# Patient Record
Sex: Male | Born: 1935 | Race: White | Hispanic: No | Marital: Married | Smoking: Former smoker
Health system: Southern US, Community
[De-identification: ages and names within clinical notes are randomized; demographics above are authoritative.]

## PROBLEM LIST (undated history)

## (undated) DIAGNOSIS — G459 Transient cerebral ischemic attack, unspecified: Secondary | ICD-10-CM

## (undated) DIAGNOSIS — I219 Acute myocardial infarction, unspecified: Secondary | ICD-10-CM

## (undated) DIAGNOSIS — I10 Essential (primary) hypertension: Secondary | ICD-10-CM

## (undated) DIAGNOSIS — K922 Gastrointestinal hemorrhage, unspecified: Secondary | ICD-10-CM

## (undated) DIAGNOSIS — H9192 Unspecified hearing loss, left ear: Secondary | ICD-10-CM

## (undated) DIAGNOSIS — I6529 Occlusion and stenosis of unspecified carotid artery: Secondary | ICD-10-CM

## (undated) DIAGNOSIS — J449 Chronic obstructive pulmonary disease, unspecified: Secondary | ICD-10-CM

## (undated) DIAGNOSIS — E785 Hyperlipidemia, unspecified: Secondary | ICD-10-CM

## (undated) HISTORY — DX: Essential (primary) hypertension: I10

## (undated) HISTORY — DX: Chronic obstructive pulmonary disease, unspecified: J44.9

## (undated) HISTORY — PX: CORONARY ARTERY BYPASS GRAFT: SHX141

## (undated) HISTORY — PX: CHOLECYSTECTOMY: SHX55

## (undated) HISTORY — PX: OTHER SURGICAL HISTORY: SHX169

## (undated) HISTORY — DX: Unspecified hearing loss, left ear: H91.92

## (undated) HISTORY — DX: Gastrointestinal hemorrhage, unspecified: K92.2

## (undated) HISTORY — DX: Occlusion and stenosis of unspecified carotid artery: I65.29

## (undated) HISTORY — DX: Hyperlipidemia, unspecified: E78.5

## (undated) HISTORY — PX: APPENDECTOMY: SHX54

## (undated) HISTORY — DX: Transient cerebral ischemic attack, unspecified: G45.9

## (undated) HISTORY — PX: PROSTATE SURGERY: SHX751

---

## 2003-03-08 ENCOUNTER — Other Ambulatory Visit: Payer: Self-pay

## 2005-07-06 ENCOUNTER — Encounter: Payer: Self-pay | Admitting: Family Medicine

## 2005-07-06 LAB — CONVERTED CEMR LAB: PSA: 0.68 ng/mL

## 2005-10-16 ENCOUNTER — Ambulatory Visit: Payer: Self-pay | Admitting: Unknown Physician Specialty

## 2005-10-23 ENCOUNTER — Emergency Department: Payer: Self-pay | Admitting: Emergency Medicine

## 2005-11-13 ENCOUNTER — Ambulatory Visit: Payer: Self-pay | Admitting: Ophthalmology

## 2005-11-13 ENCOUNTER — Other Ambulatory Visit: Payer: Self-pay

## 2005-12-17 ENCOUNTER — Ambulatory Visit: Payer: Self-pay | Admitting: Family Medicine

## 2006-02-03 ENCOUNTER — Ambulatory Visit: Payer: Self-pay | Admitting: Family Medicine

## 2006-04-12 ENCOUNTER — Emergency Department: Payer: Self-pay | Admitting: Emergency Medicine

## 2006-04-13 ENCOUNTER — Emergency Department: Payer: Self-pay | Admitting: Emergency Medicine

## 2006-04-16 ENCOUNTER — Ambulatory Visit: Payer: Self-pay | Admitting: Specialist

## 2006-04-16 ENCOUNTER — Other Ambulatory Visit: Payer: Self-pay

## 2006-04-23 ENCOUNTER — Ambulatory Visit: Payer: Self-pay | Admitting: Specialist

## 2006-05-04 ENCOUNTER — Ambulatory Visit: Payer: Self-pay | Admitting: Family Medicine

## 2006-05-04 LAB — CONVERTED CEMR LAB
AST: 21 units/L (ref 0–37)
BUN: 20 mg/dL (ref 6–23)
Chloride: 108 meq/L (ref 96–112)
Cholesterol: 191 mg/dL (ref 0–200)
Creatinine, Ser: 1.5 mg/dL (ref 0.4–1.5)
GFR calc Af Amer: 59 mL/min
GFR calc non Af Amer: 49 mL/min
HDL: 41.7 mg/dL (ref >39.0)
Potassium: 4.4 meq/L (ref 3.5–5.1)
Total Bilirubin: 0.6 mg/dL (ref 0.3–1.2)

## 2006-05-21 ENCOUNTER — Ambulatory Visit: Payer: Self-pay | Admitting: Specialist

## 2006-06-22 ENCOUNTER — Ambulatory Visit: Payer: Self-pay | Admitting: Specialist

## 2006-07-24 ENCOUNTER — Ambulatory Visit: Payer: Self-pay | Admitting: Family Medicine

## 2006-08-05 ENCOUNTER — Ambulatory Visit: Payer: Self-pay | Admitting: Family Medicine

## 2006-08-05 DIAGNOSIS — E7800 Pure hypercholesterolemia, unspecified: Secondary | ICD-10-CM

## 2006-08-12 ENCOUNTER — Telehealth: Payer: Self-pay | Admitting: Family Medicine

## 2006-08-12 ENCOUNTER — Encounter: Payer: Self-pay | Admitting: Family Medicine

## 2006-10-13 ENCOUNTER — Telehealth (INDEPENDENT_AMBULATORY_CARE_PROVIDER_SITE_OTHER): Payer: Self-pay | Admitting: *Deleted

## 2006-11-18 ENCOUNTER — Ambulatory Visit: Payer: Self-pay | Admitting: Family Medicine

## 2006-11-18 ENCOUNTER — Encounter (INDEPENDENT_AMBULATORY_CARE_PROVIDER_SITE_OTHER): Payer: Self-pay | Admitting: *Deleted

## 2006-11-18 LAB — CONVERTED CEMR LAB
ALT: 18 units/L (ref 0–40)
Direct LDL: 129.1 mg/dL
HDL: 42.2 mg/dL (ref >39.0)

## 2006-11-19 LAB — CONVERTED CEMR LAB
ALT: 26 units/L (ref 0–53)
AST: 21 units/L (ref 0–37)
Bilirubin, Direct: 0.1 mg/dL (ref 0.0–0.3)
CO2: 29 meq/L (ref 19–32)
Chloride: 106 meq/L (ref 96–112)
Cholesterol: 169 mg/dL (ref 0–200)
Creatinine, Ser: 1.2 mg/dL (ref 0.4–1.5)
GFR calc non Af Amer: 63 mL/min
Glucose, Bld: 95 mg/dL (ref 70–99)
HDL: 43.9 mg/dL (ref >39.0)
LDL Cholesterol: 104 mg/dL — ABNORMAL HIGH (ref 0–99)
Sodium: 138 meq/L (ref 135–145)
Total Bilirubin: 0.9 mg/dL (ref 0.3–1.2)
Total Protein: 6.5 g/dL (ref 6.0–8.3)

## 2007-02-02 ENCOUNTER — Encounter: Payer: Self-pay | Admitting: Family Medicine

## 2007-02-02 DIAGNOSIS — I10 Essential (primary) hypertension: Secondary | ICD-10-CM

## 2007-02-03 ENCOUNTER — Ambulatory Visit: Payer: Self-pay | Admitting: Family Medicine

## 2007-02-10 ENCOUNTER — Ambulatory Visit: Payer: Self-pay | Admitting: Cardiology

## 2007-02-19 ENCOUNTER — Encounter (INDEPENDENT_AMBULATORY_CARE_PROVIDER_SITE_OTHER): Payer: Self-pay | Admitting: *Deleted

## 2007-02-23 ENCOUNTER — Ambulatory Visit: Payer: Self-pay | Admitting: Family Medicine

## 2007-02-23 LAB — CONVERTED CEMR LAB
ALT: 19 units/L (ref 0–53)
AST: 19 units/L (ref 0–37)
Direct LDL: 141.4 mg/dL

## 2007-08-16 ENCOUNTER — Ambulatory Visit: Payer: Self-pay | Admitting: Family Medicine

## 2007-08-16 DIAGNOSIS — I25810 Atherosclerosis of coronary artery bypass graft(s) without angina pectoris: Secondary | ICD-10-CM

## 2007-08-17 ENCOUNTER — Ambulatory Visit: Payer: Self-pay | Admitting: Cardiology

## 2007-08-18 ENCOUNTER — Encounter: Payer: Self-pay | Admitting: Cardiology

## 2007-08-18 LAB — CONVERTED CEMR LAB
BUN: 23 mg/dL (ref 6–23)
Basophils Relative: 1 % (ref 0–1)
Chloride: 109 meq/L (ref 96–112)
Glucose, Bld: 99 mg/dL (ref 70–99)
Hemoglobin: 12.7 g/dL — ABNORMAL LOW (ref 13.0–17.0)
INR: 0.9 (ref 0.0–1.5)
Lymphocytes Relative: 21 % (ref 12–46)
Monocytes Absolute: 0.8 10*3/uL (ref 0.1–1.0)
Monocytes Relative: 12 % (ref 3–12)
Neutro Abs: 4 10*3/uL (ref 1.7–7.7)
Neutrophils Relative %: 61 % (ref 43–77)
RBC: 3.96 M/uL — ABNORMAL LOW (ref 4.22–5.81)
Sodium: 140 meq/L (ref 135–145)
WBC: 6.5 10*3/uL (ref 4.0–10.5)
aPTT: 34 s (ref 24–37)

## 2007-08-20 ENCOUNTER — Ambulatory Visit: Payer: Self-pay | Admitting: Family Medicine

## 2007-08-24 ENCOUNTER — Inpatient Hospital Stay (HOSPITAL_COMMUNITY): Admission: AD | Admit: 2007-08-24 | Discharge: 2007-08-29 | Payer: Self-pay | Admitting: Cardiology

## 2007-08-24 ENCOUNTER — Ambulatory Visit: Payer: Self-pay | Admitting: Cardiology

## 2007-08-24 ENCOUNTER — Encounter: Payer: Self-pay | Admitting: Surgery

## 2007-08-24 ENCOUNTER — Inpatient Hospital Stay (HOSPITAL_BASED_OUTPATIENT_CLINIC_OR_DEPARTMENT_OTHER): Admission: RE | Admit: 2007-08-24 | Discharge: 2007-08-24 | Payer: Self-pay | Admitting: Cardiology

## 2007-08-24 ENCOUNTER — Ambulatory Visit: Payer: Self-pay | Admitting: Surgery

## 2007-08-25 LAB — CONVERTED CEMR LAB
ALT: 14 units/L (ref 0–53)
Cholesterol: 177 mg/dL (ref 0–200)
LDL Cholesterol: 115 mg/dL — ABNORMAL HIGH (ref 0–99)

## 2007-09-10 ENCOUNTER — Ambulatory Visit: Payer: Self-pay | Admitting: Cardiology

## 2007-09-14 ENCOUNTER — Encounter: Admission: RE | Admit: 2007-09-14 | Discharge: 2007-09-14 | Payer: Self-pay | Admitting: Surgery

## 2007-09-14 ENCOUNTER — Ambulatory Visit: Payer: Self-pay | Admitting: Surgery

## 2007-09-27 ENCOUNTER — Ambulatory Visit: Payer: Self-pay | Admitting: Thoracic Surgery (Cardiothoracic Vascular Surgery)

## 2007-10-05 ENCOUNTER — Ambulatory Visit: Payer: Self-pay | Admitting: Surgery

## 2007-11-19 ENCOUNTER — Ambulatory Visit: Payer: Self-pay | Admitting: Internal Medicine

## 2008-04-10 ENCOUNTER — Ambulatory Visit: Payer: Self-pay | Admitting: Family Medicine

## 2008-04-10 DIAGNOSIS — L219 Seborrheic dermatitis, unspecified: Secondary | ICD-10-CM

## 2008-04-25 ENCOUNTER — Ambulatory Visit: Payer: Self-pay | Admitting: Family Medicine

## 2008-04-25 LAB — CONVERTED CEMR LAB: Cholesterol, target level: 200 mg/dL

## 2008-04-26 ENCOUNTER — Encounter: Payer: Self-pay | Admitting: Family Medicine

## 2008-05-29 ENCOUNTER — Ambulatory Visit: Payer: Self-pay | Admitting: Family Medicine

## 2008-05-29 LAB — CONVERTED CEMR LAB: OCCULT 1: NEGATIVE

## 2008-05-30 ENCOUNTER — Encounter (INDEPENDENT_AMBULATORY_CARE_PROVIDER_SITE_OTHER): Payer: Self-pay | Admitting: *Deleted

## 2008-09-08 ENCOUNTER — Ambulatory Visit: Payer: Self-pay | Admitting: Family Medicine

## 2008-10-27 ENCOUNTER — Ambulatory Visit: Payer: Self-pay | Admitting: Internal Medicine

## 2008-10-27 ENCOUNTER — Ambulatory Visit: Payer: Self-pay | Admitting: Family Medicine

## 2008-10-30 ENCOUNTER — Ambulatory Visit: Payer: Self-pay | Admitting: Cardiovascular Disease

## 2008-10-30 ENCOUNTER — Encounter: Payer: Self-pay | Admitting: Internal Medicine

## 2008-10-30 LAB — CONVERTED CEMR LAB
Alkaline Phosphatase: 44 units/L (ref 39–117)
Bilirubin, Direct: 0.1 mg/dL (ref 0.0–0.3)
Calcium: 9.2 mg/dL (ref 8.4–10.5)
GFR calc non Af Amer: 48.45 mL/min (ref >60)
HDL: 42.3 mg/dL (ref >39.00)
LDL Cholesterol: 110 mg/dL — ABNORMAL HIGH (ref 0–99)
Sodium: 139 meq/L (ref 135–145)
Total Bilirubin: 0.6 mg/dL (ref 0.3–1.2)
Total CHOL/HDL Ratio: 4
VLDL: 12.2 mg/dL (ref 0.0–40.0)

## 2009-01-31 ENCOUNTER — Ambulatory Visit: Payer: Self-pay | Admitting: Family Medicine

## 2009-01-31 LAB — CONVERTED CEMR LAB
Albumin: 3.8 g/dL (ref 3.5–5.2)
Alkaline Phosphatase: 38 units/L — ABNORMAL LOW (ref 39–117)
Bilirubin, Direct: 0 mg/dL (ref 0.0–0.3)
Calcium: 9.2 mg/dL (ref 8.4–10.5)
Creatinine, Ser: 1.4 mg/dL (ref 0.4–1.5)
GFR calc non Af Amer: 52.43 mL/min (ref >60)
HDL: 50 mg/dL (ref >39.00)
Sodium: 141 meq/L (ref 135–145)
Total CHOL/HDL Ratio: 3
VLDL: 15.6 mg/dL (ref 0.0–40.0)

## 2009-02-02 ENCOUNTER — Ambulatory Visit: Payer: Self-pay | Admitting: Family Medicine

## 2009-02-02 DIAGNOSIS — K5900 Constipation, unspecified: Secondary | ICD-10-CM | POA: Insufficient documentation

## 2009-02-02 LAB — CONVERTED CEMR LAB
Bilirubin Urine: NEGATIVE
Ketones, urine, test strip: NEGATIVE
Nitrite: NEGATIVE
Urobilinogen, UA: 0.2
pH: 5

## 2009-02-03 ENCOUNTER — Encounter: Payer: Self-pay | Admitting: Family Medicine

## 2009-02-16 ENCOUNTER — Ambulatory Visit: Payer: Self-pay | Admitting: Family Medicine

## 2009-02-19 ENCOUNTER — Encounter (INDEPENDENT_AMBULATORY_CARE_PROVIDER_SITE_OTHER): Payer: Self-pay | Admitting: *Deleted

## 2009-03-15 ENCOUNTER — Encounter: Payer: Self-pay | Admitting: Family Medicine

## 2009-03-16 ENCOUNTER — Ambulatory Visit: Payer: Self-pay | Admitting: Family Medicine

## 2009-03-16 DIAGNOSIS — R413 Other amnesia: Secondary | ICD-10-CM

## 2009-03-23 ENCOUNTER — Ambulatory Visit: Payer: Self-pay | Admitting: Internal Medicine

## 2009-03-27 ENCOUNTER — Inpatient Hospital Stay (HOSPITAL_BASED_OUTPATIENT_CLINIC_OR_DEPARTMENT_OTHER): Admission: RE | Admit: 2009-03-27 | Discharge: 2009-03-27 | Payer: Self-pay | Admitting: Internal Medicine

## 2009-03-27 ENCOUNTER — Ambulatory Visit: Payer: Self-pay | Admitting: Internal Medicine

## 2009-03-27 LAB — CONVERTED CEMR LAB
BUN: 27 mg/dL — ABNORMAL HIGH (ref 6–23)
Calcium: 10.1 mg/dL (ref 8.4–10.5)
Chloride: 104 meq/L (ref 96–112)
Creatinine, Ser: 1.41 mg/dL (ref 0.40–1.50)
INR: 1.05 (ref <1.50)
MCHC: 33.1 g/dL (ref 30.0–36.0)
Platelets: 239 10*3/uL (ref 150–400)
Prothrombin Time: 13.6 s (ref 11.6–15.2)
RBC: 4.08 M/uL — ABNORMAL LOW (ref 4.22–5.81)

## 2009-04-03 ENCOUNTER — Ambulatory Visit: Payer: Self-pay

## 2009-04-03 ENCOUNTER — Encounter: Payer: Self-pay | Admitting: Internal Medicine

## 2009-04-04 ENCOUNTER — Telehealth (INDEPENDENT_AMBULATORY_CARE_PROVIDER_SITE_OTHER): Payer: Self-pay | Admitting: *Deleted

## 2009-04-10 ENCOUNTER — Encounter: Payer: Self-pay | Admitting: Internal Medicine

## 2009-04-10 ENCOUNTER — Ambulatory Visit: Payer: Self-pay | Admitting: Internal Medicine

## 2009-04-30 ENCOUNTER — Encounter: Payer: Self-pay | Admitting: Internal Medicine

## 2009-05-01 ENCOUNTER — Encounter: Payer: Self-pay | Admitting: Internal Medicine

## 2009-05-01 ENCOUNTER — Ambulatory Visit: Payer: Self-pay

## 2009-05-03 ENCOUNTER — Ambulatory Visit: Payer: Self-pay | Admitting: Internal Medicine

## 2009-05-03 ENCOUNTER — Encounter: Payer: Self-pay | Admitting: Internal Medicine

## 2009-05-03 DIAGNOSIS — I6529 Occlusion and stenosis of unspecified carotid artery: Secondary | ICD-10-CM | POA: Insufficient documentation

## 2009-05-22 ENCOUNTER — Ambulatory Visit: Payer: Self-pay | Admitting: Family Medicine

## 2009-05-22 ENCOUNTER — Encounter (INDEPENDENT_AMBULATORY_CARE_PROVIDER_SITE_OTHER): Payer: Self-pay | Admitting: *Deleted

## 2009-05-22 DIAGNOSIS — J449 Chronic obstructive pulmonary disease, unspecified: Secondary | ICD-10-CM

## 2009-05-22 DIAGNOSIS — J4489 Other specified chronic obstructive pulmonary disease: Secondary | ICD-10-CM | POA: Insufficient documentation

## 2009-05-25 LAB — CONVERTED CEMR LAB
ALT: 15 units/L (ref 0–53)
AST: 19 units/L (ref 0–37)
BUN: 20 mg/dL (ref 6–23)
Basophils Absolute: 0.1 10*3/uL (ref 0.0–0.1)
Bilirubin, Direct: 0.1 mg/dL (ref 0.0–0.3)
Chloride: 107 meq/L (ref 96–112)
Eosinophils Absolute: 0.3 10*3/uL (ref 0.0–0.7)
GFR calc non Af Amer: 52.39 mL/min (ref >60)
Lymphocytes Relative: 23.7 % (ref 12.0–46.0)
MCHC: 34.6 g/dL (ref 30.0–36.0)
MCV: 95.5 fL (ref 78.0–100.0)
Monocytes Absolute: 0.6 10*3/uL (ref 0.1–1.0)
Neutrophils Relative %: 56.7 % (ref 43.0–77.0)
Platelets: 211 10*3/uL (ref 150.0–400.0)
Potassium: 4.3 meq/L (ref 3.5–5.1)
RBC: 3.75 M/uL — ABNORMAL LOW (ref 4.22–5.81)
RDW: 12.1 % (ref 11.5–14.6)
TSH: 2.21 microintl units/mL (ref 0.35–5.50)
Total Bilirubin: 0.4 mg/dL (ref 0.3–1.2)
Vit D, 25-Hydroxy: 49 ng/mL (ref 30–89)
Vitamin B-12: 241 pg/mL (ref 211–911)

## 2009-06-28 ENCOUNTER — Telehealth: Payer: Self-pay | Admitting: Family Medicine

## 2009-07-18 ENCOUNTER — Ambulatory Visit: Payer: Self-pay | Admitting: Family Medicine

## 2009-07-18 LAB — CONVERTED CEMR LAB
ALT: 14 units/L (ref 0–53)
Albumin: 3.9 g/dL (ref 3.5–5.2)
Alkaline Phosphatase: 42 units/L (ref 39–117)
Bilirubin, Direct: 0.1 mg/dL (ref 0.0–0.3)
CO2: 31 meq/L (ref 19–32)
Calcium: 9.4 mg/dL (ref 8.4–10.5)
Chloride: 107 meq/L (ref 96–112)
Creatinine, Ser: 1.5 mg/dL (ref 0.4–1.5)
Glucose, Bld: 88 mg/dL (ref 70–99)
Sodium: 143 meq/L (ref 135–145)
Total CHOL/HDL Ratio: 3
Total Protein: 7 g/dL (ref 6.0–8.3)

## 2009-10-29 ENCOUNTER — Ambulatory Visit: Payer: Self-pay | Admitting: Family Medicine

## 2009-11-02 LAB — CONVERTED CEMR LAB
Cholesterol: 186 mg/dL (ref 0–200)
HDL: 50.1 mg/dL (ref >39.00)
LDL Cholesterol: 123 mg/dL — ABNORMAL HIGH (ref 0–99)
VLDL: 12.8 mg/dL (ref 0.0–40.0)

## 2009-11-05 ENCOUNTER — Encounter: Payer: Self-pay | Admitting: Internal Medicine

## 2009-11-06 ENCOUNTER — Ambulatory Visit: Payer: Self-pay

## 2009-11-06 ENCOUNTER — Encounter: Payer: Self-pay | Admitting: Internal Medicine

## 2009-11-07 ENCOUNTER — Encounter (INDEPENDENT_AMBULATORY_CARE_PROVIDER_SITE_OTHER): Payer: Self-pay | Admitting: *Deleted

## 2010-01-04 ENCOUNTER — Encounter (INDEPENDENT_AMBULATORY_CARE_PROVIDER_SITE_OTHER): Payer: Self-pay | Admitting: *Deleted

## 2010-01-22 ENCOUNTER — Ambulatory Visit: Payer: Self-pay | Admitting: Family Medicine

## 2010-01-22 LAB — CONVERTED CEMR LAB
Albumin: 3.5 g/dL (ref 3.5–5.2)
Alkaline Phosphatase: 44 units/L (ref 39–117)
Bilirubin, Direct: 0.1 mg/dL (ref 0.0–0.3)
Calcium: 9.3 mg/dL (ref 8.4–10.5)
GFR calc non Af Amer: 45.82 mL/min (ref >60)
HDL: 44.4 mg/dL (ref >39.00)
LDL Cholesterol: 90 mg/dL (ref 0–99)
Potassium: 4.6 meq/L (ref 3.5–5.1)
Sodium: 143 meq/L (ref 135–145)
Total Bilirubin: 0.6 mg/dL (ref 0.3–1.2)
Total CHOL/HDL Ratio: 3
VLDL: 17.8 mg/dL (ref 0.0–40.0)

## 2010-01-25 ENCOUNTER — Ambulatory Visit: Payer: Self-pay | Admitting: Family Medicine

## 2010-01-25 ENCOUNTER — Ambulatory Visit: Payer: Self-pay | Admitting: Internal Medicine

## 2010-01-25 LAB — HM COLONOSCOPY

## 2010-01-29 ENCOUNTER — Telehealth: Payer: Self-pay | Admitting: Internal Medicine

## 2010-02-04 ENCOUNTER — Telehealth: Payer: Self-pay | Admitting: Internal Medicine

## 2010-02-12 ENCOUNTER — Ambulatory Visit: Payer: Self-pay | Admitting: Family Medicine

## 2010-02-12 LAB — FECAL OCCULT BLOOD, GUAIAC: Fecal Occult Blood: NEGATIVE

## 2010-02-13 ENCOUNTER — Ambulatory Visit: Payer: Self-pay | Admitting: Family Medicine

## 2010-02-13 ENCOUNTER — Encounter (INDEPENDENT_AMBULATORY_CARE_PROVIDER_SITE_OTHER): Payer: Self-pay | Admitting: *Deleted

## 2010-02-21 ENCOUNTER — Ambulatory Visit: Payer: Self-pay | Admitting: Internal Medicine

## 2010-02-21 ENCOUNTER — Ambulatory Visit (HOSPITAL_COMMUNITY): Admission: RE | Admit: 2010-02-21 | Discharge: 2010-02-21 | Payer: Self-pay | Admitting: Internal Medicine

## 2010-02-26 ENCOUNTER — Encounter: Payer: Self-pay | Admitting: Family Medicine

## 2010-04-12 ENCOUNTER — Encounter: Payer: Self-pay | Admitting: Family Medicine

## 2010-04-12 ENCOUNTER — Ambulatory Visit
Admission: RE | Admit: 2010-04-12 | Discharge: 2010-04-12 | Payer: Self-pay | Source: Home / Self Care | Attending: Family Medicine | Admitting: Family Medicine

## 2010-05-07 NOTE — Assessment & Plan Note (Signed)
Summary: HEAD AND NECK PAIN/DLO   Vital Signs:  Patient profile:   77 year old male Height:      64 inches Weight:      142.0 pounds BMI:     24.46 Temp:     98.8 degrees F oral Pulse rate:   62 / minute Pulse rhythm:   regular BP sitting:   130 / 62  (left arm) Cuff size:   regular  Vitals Entered By: Benny Lennert CMA Duncan Dull) (February 13, 2010 10:52 AM)  History of Present Illness: Chief complaint head and neck pain  PAin sharp in left posteriro occiput.Marland Kitchenlasted 3 days. started 1 week ago. HAD noted pain at last OV 4 months ago. Feels like neck tight when flexing pain back. TTp over left occiput. Cannot move head side to side with out pain...most painful turing face to left. No fall or injury in neck recently.  No pain radiating down arm... some pain in left upper back/shoulder. No numbness or weakness in arms.  Hears crackling sound when moving neck occ.   Plain films showed severe disc degeneration and arythritis in neck. Initial improvement with diclofenac..none now. Cyclobenzaprin helped first time he too it but not much.        Problems Prior to Update: 1)  Dyspnea  (ICD-786.05) 2)  Neck Pain, Acute  (ICD-723.1) 3)  Wound, Hand  (ICD-882.0) 4)  Copd, Mild  (ICD-496) 5)  Carotid Artery Stenosis  (ICD-433.10) 6)  Memory Loss  (ICD-780.93) 7)  Constipation  (ICD-564.00) 8)  Cad  (ICD-414.00) 9)  Hypertension  (ICD-401.9) 10)  Hypercholesterolemia  (ICD-272.0) 11)  Dermatitis, Seborrheic  (ICD-690.10) 12)  Special Screening Malig Neoplasms Other Sites  (ICD-V76.49) 13)  Need Prophylactic Vaccination&inoculation Flu  (ICD-V04.81)  Current Medications (verified): 1)  Lisinopril 40 Mg Tabs (Lisinopril) .... Take 1 Tablet By Mouth Once A Day 2)  Fenofibrate 160 Mg  Tabs (Fenofibrate) .... Take 1 Tablet By Mouth Once A Day 3)  Adult Aspirin Ec Low Strength 81 Mg  Tbec (Aspirin) .... Take 1 Tablet By Mouth Once A Day 4)  Carvedilol 6.25 Mg Tabs (Carvedilol)  .... Take 1 Tablet By Mouth Two Times A Day 5)  Pravastatin Sodium 40 Mg Tabs (Pravastatin Sodium) .... Take One By Mouth Daily 6)  Ventolin Hfa 108 (90 Base) Mcg/act Aers (Albuterol Sulfate) .... 2 Puffs Q 4 Hours As Needed Shortness of Breath 7)  Diclofenac Sodium 75 Mg Tbec (Diclofenac Sodium) .Marland Kitchen.. 1 Tab By Mouth Two Times A Day  Allergies (verified): No Known Drug Allergies  Past History:  Past medical, surgical, family and social histories (including risk factors) reviewed, and no changes noted (except as noted below).  Past Medical History: Reviewed history from 03/23/2009 and no changes required. 1. COPD 2. Hyperlipidemia 3. Hypertension 4. CAD, s/p CABG x 4 5. Carotid stenosis    a. u/s 7/10  R: 60-79% L 40-59%  Past Surgical History: Reviewed history from 04/25/2008 and no changes required.               PFT's 2003      Thumb surgery - [facility]               Appendectomy                Prostate Surgery - for nodules,  benign 2005       Treadmill Stress Test - EF 49% (-) 2004       Hosp. - weakness, dizzy (-)  workup 2007       Carotid doppler (-) 2007       Kidney stone, lithotripsy 2009 CABG: Dr Adriana Reams  Family History: Reviewed history from 02/02/2007 and no changes required. Father: Died 80, CAD, emphysema Mother: Died 45, CAD Siblings: 12 siblings, sister with emphysema (+) MI < 57, age 77 CV:  (+) CAD  Social History: Reviewed history from 02/02/2007 and no changes required. 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 CV:  Denies chest pain or discomfort. Resp:  stable SOB.Marland Kitchen GI:  Denies abdominal pain. Neuro:  Denies falling  down, inability to speak, memory loss, numbness, poor balance, seizures, tingling, and weakness.  Physical Exam  General:  elderly appearing male in NAD Mouth:  Oral mucosa and oropharynx without lesions or exudates.  Teeth in good repair. Lungs:  Normal respiratory effort, chest expands symmetrically. Lungs are clear to auscultation, no crackles or wheezes. Heart:  Normal rate and regular rhythm. S1 and S2 normal without gallop, murmur, click, rub or other extra sounds. Msk:  ttp over insertion of trapezius, mild central vertebral ttp, trigger point in left upper shoulder decrease ROM of neck, crepitus heard with ROM  Pulses:  R and L posterior tibial pulses are full and equal bilaterally  Neurologic:  No cranial nerve deficits noted. Station and gait are normal. Plantar reflexes are down-going bilaterally. DTRs are symmetrical throughout. Sensory, motor and coordinative functions appear intact.   Impression & Recommendations:  Problem # 1:  NECK PAIN, ACUTE (ICD-723.1) Severe dengerative changes in neck on plain film. Minimal response to diclofenac..will try meloxicam, contnue heat and home PT. Refer to PM and R for consideration of further treatment and possibly cervical spine steroid injection. No suggestion of radiculopaty at this point.  The following medications were removed from the medication list:    Diclofenac Sodium 75 Mg Tbec (Diclofenac sodium) .Marland Kitchen... 1 tab by mouth two times a day    Cyclobenzaprine Hcl 10 Mg Tabs (Cyclobenzaprine hcl) .Marland Kitchen... 1 tab by mouth at bedtime as needed muscle spasm His updated medication list for this problem includes:    Adult Aspirin Ec Low Strength 81 Mg Tbec (Aspirin) .Marland Kitchen... Take 1 tablet by mouth once a day    Meloxicam 15 Mg Tabs (Meloxicam) .Marland Kitchen... 1 tab by mouth daily  Orders: Misc. Referral (Misc. Ref)  Complete Medication List: 1)  Lisinopril 40 Mg Tabs (Lisinopril) .... Take 1 tablet by mouth once a day 2)  Fenofibrate 160 Mg Tabs  (Fenofibrate) .... Take 1 tablet by mouth once a day 3)  Adult Aspirin Ec Low Strength 81 Mg Tbec (Aspirin) .... Take 1 tablet by mouth once a day 4)  Carvedilol 6.25 Mg Tabs (Carvedilol) .... Take 1 tablet by mouth two times a day 5)  Pravastatin Sodium 40 Mg Tabs (Pravastatin sodium) .... Take one by mouth daily 6)  Ventolin Hfa 108 (90 Base) Mcg/act Aers (Albuterol sulfate) .... 2 puffs q 4 hours as needed shortness of breath 7)  Meloxicam 15 Mg Tabs (Meloxicam) .Marland Kitchen.. 1 tab by mouth daily  Patient Instructions: 1)  Stop diclofenac. 2)  Use meloxicam for pain. 3)  May use muscle relaxant at night if needed. 4)  Heat on neck with heating pad. 5)  Gentle stretching exercises.  6)  Referral Appointment Information 7)  Day/Date: 8)  Time: 9)  Place/MD: 10)  Address: 11)  Phone/Fax: 12)  Patient given appointment information. Information/Orders faxed/mailed.  Prescriptions: MELOXICAM 15 MG TABS (MELOXICAM) 1 tab by mouth daily  #30 x 0   Entered and Authorized by:   Kerby Nora MD   Signed by:   Kerby Nora MD on 02/13/2010   Method used:   [facility] to        [facility] [phone]* (retail)       [address] [place], [place]  [address]       Ph: [phone] or [phone]       Fax: [phone]   RxID:   [id number]    Orders Added: 1)  Misc. Referral [Misc. Ref] 2)  Est. Patient Level III [25956]      Current Allergies (reviewed today): No known allergies

## 2010-05-07 NOTE — Progress Notes (Signed)
Summary: GI referral cancelled.  Phone Note Outgoing Call   Summary of Call: GI Referral: Pt cancelled referral to GI physician Dr. Arlyce Dice for Constipation- Says he is much better, did not need the referral. Pt cancelled the appt. Order completed.Daine Gip  June 28, 2009 1:51 PM  Initial call taken by: Daine Gip,  June 28, 2009 1:51 PM

## 2010-05-07 NOTE — Miscellaneous (Signed)
Summary: Orders Update  Clinical Lists Changes  Orders: Added new Test order of Carotid Duplex (Carotid Duplex) - Signed 

## 2010-05-07 NOTE — Consult Note (Signed)
Summary: [facility]  [facility]   Imported By: Lanelle Bal 03/07/2010 12:45:45  _____________________________________________________________________  External Attachment:    Type:   Image     Comment:   External Document

## 2010-05-07 NOTE — Progress Notes (Signed)
Summary: Lance Bosch referral   Phone Note Outgoing Call   Call placed by: Benedict Needy, RN,  January 29, 2010 4:00 PM Call placed to: Patient Summary of Call: Attempted to call pt to make him aware of appts CPX at [facility] 11/17 11:30 Arrive at 11 am Dr. Elly Modena 11/21 at 2:10 arrive at 1:45 [facility]  Initial call taken by: Benedict Needy, RN,  January 29, 2010 4:02 PM  Follow-up for Phone Call        [facility] Benedict Needy, RN  January 30, 2010 4:47 PM   Pt called back.  Gave him lab result, and appt location, dates and times for CPX, and pulmonary referral.  Follow-up by: Cloyde Reams RN,  February 01, 2010 10:23 AM

## 2010-05-07 NOTE — Letter (Signed)
Summary: Appointment - Missed  [facility], Main Office  [address]   [place], [place] [address]   Phone: [phone]  Fax: [phone]     November 07, 2009 MRN: [id number]   Christian [name] [address] [place], [place]  [address]   Dear [name],  Our records indicate you missed your appointment on 11-06-2009   with  Dr. Gala Romney It is very important that we reach you to reschedule this appointment. We look forward to participating in your health care needs. Please contact us at the number listed above at your earliest convenience to reschedule this appointment.     Sincerely,   Lorne Skeens  [facility] Scheduling Team

## 2010-05-07 NOTE — Assessment & Plan Note (Signed)
Summary: ROV/AMD      Allergies Added: NKDA  Visit Type:  Follow-up Primary Provider:  Kerby Nora MD  CC:  c/o shortness of breath..  History of Present Illness: [name] is a very pleasant 77 year old male with history of coronary artery disease, status post bypass surgery in 2009.  He has a normal ejection fraction.  PMH also notable for HTN, HL, carotid stenosis and COPD. Returns for routine f/u.  At last visit was having some CP when carrying wood up steps. Had cath 12/10   1. Three-vessel coronary artery disease.   2. Saphenous vein graft to the posterior descending artery is patent.   3. Left internal mammary artery to left anterior descending is patent.   4. Saphenous vein graft to the obtuse marginal system is totally       occluded ostially, but  there is only moderate coronary artery       disease in the native left circumflex.   5. Normal left ventricular function.   Conclusion: His revascularization is stable despite the loss of a  vein graft.  I am not sure what is causing his exertional symptoms.  We  will check an echocardiogram and PFTs. Proceed with medical therapy  PFTs showed small airway disease. Saw Dr. Ermalene Searing who started him on albuterol inhaler which has helped markedly.  Also had echo EF 55-65%.   Cartoid u/s 1/11 R 60-79% L 40-59% stable  Returns for f/u. Still having problems with exertional dyspnea. Limiting what he does. That said, can walk at normal pace for 1/2 mile or more. Denies CP or HF. Also having problems with severe neck pain over back of neck which limit his head turning.    Current Medications (verified): 1)  Lisinopril 40 Mg Tabs (Lisinopril) .... Take 1 Tablet By Mouth Once A Day 2)  Fenofibrate 160 Mg  Tabs (Fenofibrate) .... Take 1 Tablet By Mouth Once A Day 3)  Adult Aspirin Ec Low Strength 81 Mg  Tbec (Aspirin) .... Take 1 Tablet By Mouth Once A Day 4)  Carvedilol 6.25 Mg Tabs (Carvedilol) .... Take 1 Tablet By Mouth Two Times A  Day 5)  Pravastatin Sodium 40 Mg Tabs (Pravastatin Sodium) .... Take One By Mouth Daily 6)  Ventolin Hfa 108 (90 Base) Mcg/act Aers (Albuterol Sulfate) .... 2 Puffs Q 4 Hours As Needed Shortness of Breath 7)  Diclofenac Sodium 75 Mg Tbec (Diclofenac Sodium) .Marland Kitchen.. 1 Tab By Mouth Two Times A Day 8)  Cyclobenzaprine Hcl 10 Mg Tabs (Cyclobenzaprine Hcl) .Marland Kitchen.. 1 Tab By Mouth At Bedtime As Needed Muscle Spasm  Allergies (verified): No Known Drug Allergies  Past History:  Past Medical History: Last updated: 03/23/2009 1. COPD 2. Hyperlipidemia 3. Hypertension 4. CAD, s/p CABG x 4 5. Carotid stenosis    a. u/s 7/10  R: 60-79% L 40-59%  Past Surgical History: Last updated: 04/25/2008               PFT's 2003      Thumb surgery - [facility]               Appendectomy                Prostate Surgery - for nodules,  benign 2005       Treadmill Stress Test - EF 49% (-) 2004       Hosp. 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 no resp difficulty sats 98% at rest. down to 96% with exertion HEENT: normal Neck: .sore of trapezius muscles.no JVD. Carotids 2+ bilat; R side bruit. No lymphadenopathy or thryomegaly appreciatted. Cor: PMI nondisplaced. Regular rate & rhythm. No rubs, murmur. +s4 Lungs: clear with decreased air movement Abdomen: soft, nontender, nondistended. Good bowel sounds. Extremities: no cyanosis, clubbing, rash, edema. gorin site no bruit or hematoma Neuro: alert & orientedx3, cranial nerves grossly intact. moves all 4 extremities w/o difficulty. affect pleasant    Impression & Recommendations:  Problem # 1:  CAD (ICD-414.00) Stable. No evidence of ischemia. Recent cath reassuring.  Continue current regimen.  Problem # 2:  DYSPNEA (ICD-786.05) Still struggling with fairly severe dyspnea. Will get CPX and refer to pulmonary for evaluation.   Problem # 3:  Shoulder pain Check ESR to exclude polymalgia rheumatica.   Problem # 4:  CAROTID ARTERY STENOSIS (ICD-433.10) Stable. Asymptomatic. F/u carortid u/s in 1 year.   Other Orders: EKG w/ Interpretation (93000) T-Sed Rate (Automated) ([address]) CPX Test at [facility] (CPX Test)

## 2010-05-07 NOTE — Assessment & Plan Note (Signed)
Summary: remove stitches/alc   Vital Signs:  Patient profile:   77 year old male Weight:      145.13 pounds Temp:     98.6 degrees F oral Pulse rate:   60 / minute Pulse rhythm:   regular BP sitting:   140 / 60  (left arm) Cuff size:   regular  Vitals Entered By: Janee Morn CMA (October 29, 2009 12:26 PM) CC: Remove stitches/SOB with exhertion x3 weeks   History of Present Illness: patient is a 77 year old male who presented for suture removal followup, on a procedure and trauma that  was not seen thing care of our office, and was seen and evaluated by the emergency room. Loose clean dry and intact without any  difficulties but still some  mild pain to palpation. Patient actually brings up that his primary reason for primary concern at least for being here in the office is some shortness of breath he is experiencing.  This is not acute, and he is hadn't been having this for approximately 3-6 months. In reviewing the medical record, it appears that he has had some extensive workup on this. Cardiac catheterization in December, 2010, and this is reviewed as well as the last cardiology note. He had coronary artery bypass grafting  2 years ago x4.   It appears of my colleagues and  their assessment of the patient's bladder function test  yielded that conclusion the patient has having COPD does not control. Patient did self discontinue his Spiriva.  He has been using some albuterol intermittently   Cath 03/2009  Had CABG about 2 years ago x 4 Was using albuterol and using some Spiriva did not feel like he was getting better on it.   abnormal PFT  COPD, start advair  f/u with Dr. Gala Romney --   Allergies: No Known Drug Allergies  Past History:  Past medical, surgical, family and social histories (including risk factors) reviewed, and no changes noted (except as noted below).  Past Medical History: Reviewed history from 03/23/2009 and no changes required. 1. COPD 2.  Hyperlipidemia 3. Hypertension 4. CAD, s/p CABG x 4 5. Carotid stenosis    a. u/s 7/10  R: 60-79% L 40-59%  Past Surgical History: Reviewed history from 04/25/2008 and no changes required.               PFT's 2003      Thumb surgery - [facility]               Appendectomy                Prostate Surgery - for nodules,  benign 2005       Treadmill Stress Test - EF 49% (-) 2004       Hosp. - weakness, dizzy (-) workup 2007       Carotid doppler (-) 2007       Kidney stone, lithotripsy 2009 CABG: Dr Adriana Reams  Family History: Reviewed history from 02/02/2007 and no changes required. Father: Died 49, CAD, emphysema Mother: Died 67, CAD Siblings: 12 siblings, sister with emphysema (+) MI < 41, age 61 CV:  (+) CAD  Social History: Reviewed history from 02/02/2007 and no changes required. 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 CV:  Complains of shortness of breath with exertion; denies chest pain or discomfort and palpitations.  Physical Exam  General:  elderly appearing male in NAD Head:  Normocephalic and atraumatic without obvious abnormalities.  Ears:  no external deformities.   Nose:  no external deformity.   Lungs:  Normal respiratory effort, chest expands symmetrically. Lungs are clear to auscultation, no crackles or wheezes. Heart:  Normal rate and regular rhythm. S1 and S2 normal without gallop, murmur, click, rub or other extra sounds. Extremities:  wound clean dry and intact,  suture removed easily by nursing staff. Neurologic:  No cranial nerve deficits noted. Station and gait are normal. Plantar reflexes are down-going bilaterally. DTRs are  symmetrical throughout. Sensory, motor and coordinative functions appear intact. Psych:  Cognition and judgment appear intact. Alert and cooperative with normal attention span and concentration. No apparent delusions, illusions, hallucinations   Impression & Recommendations:  Problem # 1:  COPD, MILD (ICD-496) the patient is having shortness of breath,  or his report this is not new as been ongoing for  minimal in months to 6 months.  To discontinue some of his COPD medications, and previous cardiac workup, this was felt to be his primary cause.  He had a non-concerning cardiac  catheterization in December 2010.  Recommended the patient restart a maintenance COPD medication including Advair or Spiriva, however the patient he declined and was not open to this idea. An albuterol refill was requested, and I gave him one refill, and urged him not to overuse this medication.  Patient has upcoming appointment with his cardiologist for regular followup,  have asked our office to  see if this can be moved up  a little bit sooner.  >25 minutes spent in face to face time with patient, >50% spent in counselling or coordination of care   The following medications were removed from the medication list:    Spiriva Handihaler 18 Mcg Caps (Tiotropium bromide monohydrate) .Marland Kitchen... 1 cap inh daily    Advair Diskus 250-50 Mcg/dose Aepb (Fluticasone-salmeterol) .Marland Kitchen... 1 puff two times a day His updated medication list for this problem includes:    Ventolin Hfa 108 (90 Base) Mcg/act Aers (Albuterol sulfate) .Marland Kitchen... 2 puffs q 4 hours as needed shortness of breath  Problem # 2:  CAD (ICD-414.00)  His updated medication list for this problem includes:    Lisinopril 40 Mg Tabs (Lisinopril) .Marland Kitchen... Take 1 tablet by mouth once a day    Adult Aspirin Ec Low Strength 81 Mg Tbec (Aspirin) .Marland Kitchen... Take 1 tablet by mouth once a day    Carvedilol 6.25 Mg Tabs (Carvedilol) .Marland Kitchen... Take 1 tablet by mouth two times a day  Problem # 3:   WOUND, HAND (ICD-882.0) 10/19/2009 DOI  Orders: Suture Removal by Non-Operative MD (S0109)  Complete Medication List: 1)  Lisinopril 40 Mg Tabs (Lisinopril) .... Take 1 tablet by mouth once a day 2)  Fenofibrate 160 Mg Tabs (Fenofibrate) .... Take 1 tablet by mouth once a day 3)  Adult Aspirin Ec Low Strength 81 Mg Tbec (Aspirin) .... Take 1 tablet by mouth once a day 4)  Carvedilol 6.25 Mg Tabs (Carvedilol) .... Take 1 tablet by mouth two times a day 5)  Pravastatin Sodium 40 Mg Tabs (Pravastatin sodium) .... Take one by mouth daily 6)  Ventolin Hfa 108 (90 Base) Mcg/act Aers (Albuterol sulfate) .... 2 puffs q 4 hours as needed shortness of breath  Patient Instructions: 1)  [place]: see if Dr. Gala Romney can see a little eariler than a month from now? Prescriptions: VENTOLIN HFA 108 (90 BASE) MCG/ACT AERS (ALBUTEROL SULFATE) 2 puffs q 4 hours as needed shortness of breath  #1 x 0   Entered and Authorized by:   Hannah Beat MD   Signed by:   Hannah Beat MD on 10/29/2009   Method used:   [facility]        [facility] [phone]* (retail)       [address] [place], [place]  [address]       Ph: [phone] or [phone]       Fax: [phone]   RxID:   [id number] ADVAIR DISKUS 250-50 MCG/DOSE AEPB (FLUTICASONE-SALMETEROL) 1 puff two times a day  #1 x 11   Entered and Authorized by:   Hannah Beat MD   Signed by:   Hannah Beat MD on 10/29/2009   Method used:   Electronically to        [facility] [phone]* (retail)       [address] [place], [place]  [address]       Ph: [phone] or [phone]       Fax: [phone]   RxID:   [id number]   Current Allergies (reviewed today): No known allergies

## 2010-05-07 NOTE — Letter (Signed)
Summary: Appointment - Reschedule  [facility], Main Office  [address]   [place], [place] [address]   Phone: [phone]  Fax: [phone]     January 04, 2010 MRN: [id number]   Christian [name] [address] [place], [place]  [address]   Dear Christian [name],   Due to a change in our office schedule, your appointment on 01-09-2010  at 9:45               must be changed.  It is very important that we reach you to reschedule this appointment. We look forward to participating in your health care needs. Please contact us at the number listed above at your earliest convenience to reschedule this appointment.     Sincerely,      Lorne Skeens  [facility] Scheduling Team

## 2010-05-07 NOTE — Assessment & Plan Note (Signed)
Summary: 30 MIN PER MD OV MEMORY AND CONSTIPATION/DLO   Vital Signs:  Patient profile:   77 year old male Height:      64 inches Weight:      144.0 pounds BMI:     24.81 Temp:     97.5 degrees F oral Pulse rate:   60 / minute Pulse rhythm:   regular BP sitting:   130 / 70  (left arm) Cuff size:   regular  Vitals Entered By: Benny Lennert CMA [facility]) (May 22, 2009 9:26 AM)  History of Present Illness: Chief complaint memory loss and constipation  Recently seen Cards..per last note..revscularization stable..unclear source of exertional chest pain symptoms. ECHO showed nml EF.  CBC and CMET nml. very mild anemia.  PFTs showed small mild  airway disease..but he says he was using albuterol at the time. Carotid doppler reeval pending.  Told to stop plavix.  He states that proair heps significantly with exertional SOB. (Had left over from past post inflammatory bronchospasm) He does have long term hlistory of heavy smoking use. Quit 9 years ago.   Constipation, chronic.Marland Kitchenusing 2 stool softner a day. Neg hemeoccult in 2010..refused colonoscopy in past last in 1982 showed polyps..he is now willing to undertake colonoscopy. Trying to increase fiber in diet. Lots of water.   Memory, gradual decrease..difficulty saying words, remebering where things are, short term. MMSE today 30/30. Good longterm memory.   Problems Prior to Update: 1)  Carotid Artery Stenosis  (ICD-433.10) 2)  Memory Loss  (ICD-780.93) 3)  Chest Discomfort  (ICD-786.59) 4)  Constipation  (ICD-564.00) 5)  Acute Cystitis  (ICD-595.0) 6)  Bruit  (ICD-785.9) 7)  Cad  (ICD-414.00) 8)  Hypertension  (ICD-401.9) 9)  Hypercholesterolemia  (ICD-272.0) 10)  COPD  (ICD-496) 11)  Headache  (ICD-784.0) 12)  Nasolacrimal Duct Dysfunction  (ICD-375.69) 13)  Tick Bite  (ICD-E906.4) 14)  Dermatitis, Seborrheic  (ICD-690.10) 15)  Special Screening Malig Neoplasms Other Sites  (ICD-V76.49) 16)  Need Prophylactic  Vaccination&inoculation Flu  (ICD-V04.81)  Current Medications (verified): 1)  Lisinopril 40 Mg Tabs (Lisinopril) .... Take 1 Tablet By Mouth Once A Day 2)  Fenofibrate 160 Mg  Tabs (Fenofibrate) .... Take 1 Tablet By Mouth Once A Day 3)  Adult Aspirin Ec Low Strength 81 Mg  Tbec (Aspirin) .... Take 1 Tablet By Mouth Once A Day 4)  Carvedilol 6.25 Mg Tabs (Carvedilol) .... Take 1 Tablet By Mouth Two Times A Day 5)  Pravastatin Sodium 20 Mg Tabs (Pravastatin Sodium) .Marland Kitchen.. 1 Tab By Mouth Daily  Allergies (verified): No Known Drug Allergies  Past History:  Past medical, surgical, family and social histories (including risk factors) reviewed, and no changes noted (except as noted below).  Past Medical History: Reviewed history from 03/23/2009 and no changes required. 1. COPD 2. Hyperlipidemia 3. Hypertension 4. CAD, s/p CABG x 4 5. Carotid stenosis    a. u/s 7/10  R: 60-79% L 40-59%  Past Surgical History: Reviewed history from 04/25/2008 and no changes required.               PFT's 2003      Thumb surgery - [facility]               Appendectomy                Prostate Surgery - for nodules,  benign 2005       Treadmill Stress Test - EF 49% (-) 2004       Hosp. -  weakness, dizzy (-) workup 2007       Carotid doppler (-) 2007       Kidney stone, lithotripsy 2009 CABG: Dr Adriana Reams  Family History: Reviewed history from 02/02/2007 and no changes required. Father: Died 63, CAD, emphysema Mother: Died 49, CAD Siblings: 12 siblings, sister with emphysema (+) MI < 78, age 79 CV:  (+) CAD  Social History: Reviewed history from 02/02/2007 and no changes required. 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 CV:  Complains of chest pain or discomfort. Resp:  Complains of shortness of breath; denies sputum productive and wheezing. GI:  Denies abdominal pain. GU:  Denies dysuria.  Physical Exam  General:  elderly appearing male in NAD Nose:  External nasal examination shows no deformity or inflammation. Nasal mucosa are pink and moist without lesions or exudates. Mouth:  MMM Neck:  slight B bruit or thyromegaly no cervical or supraclavicular lymphadenopathy  Lungs:  Normal respiratory effort, chest expands symmetrically. Lungs are clear to auscultation, no crackles or wheezes. Heart:  Normal rate and regular rhythm. S1 and S2 normal without gallop, murmur, click, rub or other extra sounds. Abdomen:  Bowel sounds positive,abdomen soft and non-tender without masses, organomegaly or hernias noted. Pulses:  R and L posterior tibial pulses are full and equal bilaterally  Extremities:  no edmea    Impression & Recommendations:  Problem # 1:  CAD (ICD-414.00) Stable per cards. Not source o SOB and chest discomfort.  The following medications were removed from the medication list:    Plavix 75 Mg Tabs (Clopidogrel bisulfate) .Marland Kitchen... Take one tablet by mouth daily    Imdur 30 Mg Xr24h-tab (Isosorbide mononitrate) .Marland Kitchen... 1 tab by mouth daily His updated medication list for this problem includes:    Lisinopril 40 Mg Tabs (Lisinopril) .Marland Kitchen... Take 1 tablet by mouth once a day    Adult Aspirin Ec Low Strength 81 Mg Tbec (Aspirin) .Marland Kitchen... Take 1 tablet by mouth once a day    Carvedilol 6.25 Mg Tabs (Carvedilol) .Marland Kitchen... Take 1 tablet by mouth two times a day  Problem # 2:  COPD, MILD (ICD-496) HAs longterm smoking histroy...minimal changes on PFTs...but significant improvement with albuterol.  Will try trial of Spiriva daily, use albuterol as needed only.  His updated medication list for this problem includes:    Spiriva Handihaler 18 Mcg Caps (Tiotropium  bromide monohydrate) .Marland Kitchen... 1 cap inh daily  Problem # 3:  MEMORY LOSS (ICD-780.93) Eval further with labs. MMSE today 30/30 Orders: TLB-B12 + Folate Pnl (16109_60454-U98/JXB) TLB-CBC Platelet - w/Differential (85025-CBCD) TLB-BMP (Basic Metabolic Panel-BMET) (80048-METABOL) TLB-Hepatic/Liver Function Pnl (80076-HEPATIC) TLB-TSH (Thyroid Stimulating Hormone) (84443-TSH) T-RPR (Syphilis) [phone]) T-Vitamin D (25-Hydroxy) (30865-78469)  Problem # 4:  CONSTIPATION (ICD-564.00) New change, unresolved in last 6 months. Poor control.Marland KitchenMarland KitchenHemeoccult negative last year, but pt has histroy of polyps in 1982. Recommend colonoscopy...GI referral made. Will have him try miralax daily (I recommended in past, but unclear if he actually tried this..he doesn't remember.) Eval also with above labs.  Orders: Gastroenterology Referral (GI)  Complete Medication List: 1)  Lisinopril 40 Mg Tabs (Lisinopril) .... Take 1 tablet by mouth once a day 2)  Fenofibrate 160 Mg Tabs (Fenofibrate) .Marland KitchenMarland KitchenMarland Kitchen  Take 1 tablet by mouth once a day 3)  Adult Aspirin Ec Low Strength 81 Mg Tbec (Aspirin) .... Take 1 tablet by mouth once a day 4)  Carvedilol 6.25 Mg Tabs (Carvedilol) .... Take 1 tablet by mouth two times a day 5)  Pravastatin Sodium 20 Mg Tabs (Pravastatin sodium) .Marland Kitchen.. 1 tab by mouth daily 6)  Spiriva Handihaler 18 Mcg Caps (Tiotropium bromide monohydrate) .Marland Kitchen.. 1 cap inh daily  Patient Instructions: 1)  Miralax in place of stool softner once daily for constipation.  2)  Try Spiriva inhaler for breathing. Limit albuterol use.  3)  Referral Appointment Information 4)  Day/Date: 5)  Time: 6)  Place/MD: 7)  Address: 8)  Phone/Fax: 9)  Patient given appointment information. Information/Orders faxed/mailed.  10)  Please schedule a follow-up appointment in 2 months 30 min OV.   Current Allergies (reviewed today): No known allergies   Appended Document: 30 MIN PER MD OV MEMORY AND CONSTIPATION/DLO

## 2010-05-07 NOTE — Assessment & Plan Note (Signed)
Summary: Christian [name]   Vital Signs:  Patient profile:   77 year old male Height:      64 inches Weight:      141.6 pounds BMI:     24.39 Temp:     97.5 degrees F oral Pulse rate:   60 / minute Pulse rhythm:   regular BP sitting:   130 / 70  (left arm) Cuff size:   regular  Vitals Entered By: Benny Lennert CMA [facility]) (July 18, 2009 8:08 AM)  History of Present Illness: Chief complaint follow up  Recently seen Cards..per last note..revscularization stable..unclear source of exertional chest pain symptoms. ECHO showed nml EF.  CBC and CMET nml. very mild anemia.  PFTs showed small mild  airway disease..but he says he was using albuterol at the time. Carotid doppler reeval pending.  Told to stop plavix.  He states that proair heps significantly with exertional SOB. (Had left over from past post inflammatory bronchospasm) He does have long term hlistory of heavy smoking use. Quit 9 years ago.  In last few weeks he has took  Spiriva daily...but then stopped..he felt his DO has resolved now. Only using albuterol every 2 weeks.  He is able to do all the heavy lifting and miovng he was able to in past without DOE.  Constipation, chronic..resolved with miralax every few days. Neg hemeoccult in 2010..refused colonoscopy in past last in 1982 showed polyps...we scheduled him to be seen by GI for colonoscopy..he cancelled this appt.    Memory, gradual decrease..difficulty saying words, remebering where things are, short term. MMSE today 30/30 at last OV.  Good longterm memory. LAb work up negative.    Lipid Management History:      Positive NCEP/ATP III risk factors include male age 21 years old or older, hypertension, and ASHD (either angina/prior MI/prior CABG).  Negative NCEP/ATP III risk factors include non-tobacco-user status.        Comments: Due for 6 month cholesterol caeck. .   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 Take 1 Tablet By Mouth Once A Day 2)  Fenofibrate 160 Mg  Tabs (Fenofibrate) .... Take 1 Tablet By Mouth Once A Day 3)  Adult Aspirin Ec Low Strength 81 Mg  Tbec (Aspirin) .... Take 1 Tablet By Mouth Once A Day 4)  Carvedilol 6.25 Mg Tabs (Carvedilol) .... Take 1 Tablet By Mouth Two Times A Day 5)  Pravastatin Sodium 20 Mg Tabs (Pravastatin Sodium) .Marland Kitchen.. 1 Tab By Mouth Daily 6)  Spiriva Handihaler 18 Mcg Caps (Tiotropium Bromide Monohydrate) .Marland Kitchen.. 1 Cap Inh Daily  Allergies (verified): No Known Drug Allergies  Past History:  Past medical, surgical, family and social histories (including risk factors) reviewed, and no changes noted (except as noted below).  Past Medical History: Reviewed history from 03/23/2009 and no changes required. 1. COPD 2. Hyperlipidemia 3. Hypertension 4. CAD, s/p CABG x 4 5. Carotid stenosis    a. u/s 7/10  R: 60-79% L 40-59%  Past Surgical History: Reviewed history  from 04/25/2008 and no changes required.               PFT's 2003      Thumb surgery - [facility]               Appendectomy                Prostate Surgery - for nodules,  benign 2005       Treadmill Stress Test - EF 49% (-) 2004       Hosp. - weakness, dizzy (-) workup 2007       Carotid doppler (-) 2007       Kidney stone, lithotripsy 2009 CABG: Dr Adriana Reams  Family History: Reviewed history from 02/02/2007 and no changes required. Father: Died 56, CAD, emphysema Mother: Died 54, CAD Siblings: 12  siblings, sister with emphysema (+) MI < 30, age 63 CV:  (+) CAD  Social History: Reviewed history from 02/02/2007 and no changes required. 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 CV:  Denies chest pain or discomfort. Resp:  Denies sputum productive and wheezing. GI:  Denies abdominal pain and bloody stools. GU:  Denies dysuria.  Physical Exam  General:  elderly appearing male in NAD Eyes:  erythematous conjunctiva, arcus senilus..sees eye MD Mouth:  MMM Neck:  no carotid bruit or thyromegaly no cervical or supraclavicular lymphadenopathy  Lungs:  Normal respiratory effort, chest expands symmetrically. Lungs are clear to auscultation, no crackles or wheezes. Heart:  Normal rate and regular rhythm. S1 and S2 normal without gallop, murmur, click, rub or other extra sounds. Abdomen:  Bowel sounds positive,abdomen soft and non-tender without masses, organomegaly or hernias noted.   Impression & Recommendations:  Problem # 1:  COPD, MILD (ICD-496) Well controlled now, encouraged daily use.  His updated medication list for this problem includes:    Spiriva Handihaler 18 Mcg Caps (Tiotropium bromide monohydrate) .Marland Kitchen... 1 cap inh daily  Problem # 2:  CONSTIPATION (ICD-564.00) Never had colonoscopy..not interested. Using miralax every day to every few days.  Problem # 3:  MEMORY LOSS (ICD-780.93) Work up negative. Not interested in aricept. Only wants natural supplement...recommended ginko biloba and fish oil. Fresh fruit and veggies. In process of getting hearing aid  to help with hearing...does not feel like he can afford it.  Problem # 4:   HYPERCHOLESTEROLEMIA (ICD-272.0) Due for reeval.  His updated medication list for this problem includes:    Fenofibrate 160 Mg Tabs (Fenofibrate) .Marland Kitchen... Take 1 tablet by mouth once a day    Pravastatin Sodium 20 Mg Tabs (Pravastatin sodium) .Marland Kitchen... 1 tab by mouth daily  Orders: TLB-BMP (Basic Metabolic Panel-BMET) (80048-METABOL) TLB-Hepatic/Liver Function Pnl (80076-HEPATIC) TLB-Lipid Panel (80061-LIPID)  Problem # 5:  HYPERTENSION (ICD-401.9) Well controlled. Continue current medication.  His updated medication list for this problem includes:    Lisinopril 40 Mg Tabs (Lisinopril) .Marland Kitchen... Take 1 tablet by mouth once a day    Carvedilol 6.25 Mg Tabs (Carvedilol) .Marland Kitchen... Take 1 tablet by mouth two times a day  BP today: 130/70 Prior BP: 130/70 (05/22/2009)  Prior 10 Yr Risk Heart Disease: N/A (04/25/2008)  Labs Reviewed: K+: 4.3 (05/22/2009) Creat: : 1.4 (05/22/2009)  Chol: 160 (01/31/2009)   HDL: 50.00 (01/31/2009)   LDL: 94 (01/31/2009)   TG: 78.0 (01/31/2009)  Complete Medication List: 1)  Lisinopril 40 Mg Tabs (Lisinopril) .... Take 1 tablet by mouth once a day 2)  Fenofibrate 160 Mg Tabs (Fenofibrate) .... Take 1 tablet by mouth once a day 3)  Adult Aspirin Ec Low Strength 81 Mg Tbec (Aspirin) .... Take 1 tablet by mouth once a day 4)  Carvedilol 6.25 Mg Tabs (Carvedilol) .... Take 1 tablet by mouth two times a day 5)  Pravastatin Sodium 20 Mg Tabs (Pravastatin sodium) .Marland Kitchen.. 1 tab by mouth daily 6)  Spiriva Handihaler 18 Mcg Caps (Tiotropium bromide monohydrate) .Marland Kitchen.. 1 cap inh daily  Lipid Assessment/Plan:      Based on NCEP/ATP III, the patient's risk factor category is "history of coronary disease, peripheral vascular disease, cerebrovascular disease, or aortic aneurysm".  The patient's lipid goals are as follows: Total cholesterol goal is 200; LDL cholesterol goal is 100; HDL cholesterol goal is 40; Triglyceride goal is 150.  His LDL cholesterol goal has not been met.    Patient  Instructions: 1)  Recommend taking the spiriva every day for chronic bronchitis. 2)  Scheduled CPX in 6 month with fasting lipids, CMET prior. 3)  Restart fish oil, fresh fruit and fish. 4)  Cancel April 15th appt please.   Current Allergies (reviewed today): No known allergies

## 2010-05-07 NOTE — Assessment & Plan Note (Signed)
Summary: CPX/RBH   Vital Signs:  Patient profile:   77 year old male Height:      64 inches Weight:      144 pounds BMI:     24.81 Temp:     97.5 degrees F oral Pulse rate:   68 / minute Pulse rhythm:   regular BP sitting:   122 / 60  (left arm) Cuff size:   regular  Vitals Entered By: Delilah Shan CMA Duncan Dull) (January 25, 2010 9:06 AM) CC:  6 month follow up, Lipid Management, Hypertension Management  Vision Screening:Left eye w/o correction: 20 / 25 Right Eye w/o correction: 20 / 30 Both eyes w/o correction:  20/ 20        Vision Entered By: Delilah Shan CMA (AAMA) (January 25, 2010 9:20 AM) 25db HL: Left  500 hz: 25db 1000 hz: 25db 2000 hz: No Response 4000 hz: No Response Right  500 hz: 25db 1000 hz: 25db 2000 hz: No Response 4000 hz: No Response    History of Present Illness:    In last 3 months pain in left side of neck..radaites up left posterior head.  No spreading to right neck.  Using ibuprofen maximum strength...minimal.   limited motion side to side of neckl. Pain makes it difficult to sleep.  No numbness or weakness in arms/hands.  Constipation, chronic..resolved with miralax every few days. Neg hemeoccult in 2010..refused colonoscopy in past last in 1982 showed polyps...we scheduled him to be seen by GI for colonoscopy..he cancelled this appt.    Memory, gradual decrease..difficulty saying words, remebering where things are, short term. MMSE today 30/30 at last OV.  Good longterm memory. Lab work up negative. Per pt worsening symptoms.   Has appt today with Dr. Derl Barrow Cards.   Hypertension History:      He denies headache, chest pain, palpitations, neurologic problems, and syncope.  He notes no problems with any antihypertensive medication side effects.  Well controlled. Marland Kitchen        Positive major cardiovascular risk factors include male age 68 years old or older, hyperlipidemia, and hypertension.  Negative major cardiovascular risk  factors include non-tobacco-user status.        Positive history for target organ damage include ASHD (either angina/prior MI/prior CABG).    Lipid Management History:      Positive NCEP/ATP III risk factors include male age 40 years old or older, hypertension, and ASHD (either angina/prior MI/prior CABG).  Negative NCEP/ATP III risk factors include non-tobacco-user status.        Adjunctive measures started by the patient include aerobic exercise, omega-3 supplements, limit alcohol consumpton, and weight reduction.  He expresses no side effects from his lipid-lowering medication.  The patient denies any symptoms to suggest myopathy or liver disease.  Comments: Forgets to take meds a lot.losing them a lot.    Allergies: No Known Drug Allergies  Review of Systems General:  Complains of fatigue. CV:  Denies chest pain or discomfort. Resp:  Complains of shortness of breath. GI:  Denies abdominal pain and bloody stools. GU:  Denies dysuria.  Physical Exam  General:  elderly appearing male in NAD Eyes:  erythematous conjunctiva, arcus senilus..sees eye MD Mouth:  MMM Neck:  ttp left latearl neck, no central vertebral ttp  muscle spasm, decrease ROM neg Spurling no cervical or supraclavicular lymphadenopathy  Lungs:  Normal respiratory effort, chest expands symmetrically. Lungs are clear to auscultation, no crackles or wheezes. Heart:  Normal rate and regular rhythm. S1 and S2  normal without gallop, murmur, click, rub or other extra sounds. Abdomen:  Bowel sounds positive,abdomen soft and non-tender without masses, organomegaly or hernias noted. Pulses:  R and L posterior tibial pulses are full and equal bilaterally  Extremities:  no edmea Skin:  dry skin rash on right buttock..use OTC cortisone cream BID Psych:  Oriented X3, normally interactive, good eye contact, not anxious appearing, and not depressed appearing.     Impression & Recommendations:  Problem # 1:  NECK PAIN, ACUTE  (ICD-723.1) Likely degenerative changes in nack..no sign of herniated disck or radiculopathy. Start with NSAIDs, muscle relaxant and X-rays. MAy need facet joint injections, etc.  His updated medication list for this problem includes:    Adult Aspirin Ec Low Strength 81 Mg Tbec (Aspirin) .Marland Kitchen... Take 1 tablet by mouth once a day    Diclofenac Sodium 75 Mg Tbec (Diclofenac sodium) .Marland Kitchen... 1 tab by mouth two times a day    Cyclobenzaprine Hcl 10 Mg Tabs (Cyclobenzaprine hcl) .Marland Kitchen... 1 tab by mouth at bedtime as needed muscle spasm  Orders: T-Cervical Spine Comp 4 Views (72050TC)  Problem # 2:  MEMORY LOSS (ICD-780.93) Progressive per pt bu neuro exam remains nml MMSE 30/30.  LAb eval neg.  Willrefer to neuro for further eval given earlier age. Likely will consider MRI brain.  Orders: Neurology Referral (Neuro)  Problem # 3:  CAROTID ARTERY STENOSIS (ICD-433.10) Per Cards  His updated medication list for this problem includes:    Adult Aspirin Ec Low Strength 81 Mg Tbec (Aspirin) .Marland Kitchen... Take 1 tablet by mouth once a day  Problem # 4:  HYPERCHOLESTEROLEMIA (ICD-272.0) Poor control but not taking med regualrly due to memory issues. Ask him to get family assistance to help remember since he lives with his son.  His updated medication list for this problem includes:    Fenofibrate 160 Mg Tabs (Fenofibrate) .Marland Kitchen... Take 1 tablet by mouth once a day    Pravastatin Sodium 40 Mg Tabs (Pravastatin sodium) .Marland Kitchen... Take one by mouth daily  Problem # 5:  HYPERTENSION (ICD-401.9) Well controlled. Continue current medication.  His updated medication list for this problem includes:    Lisinopril 40 Mg Tabs (Lisinopril) .Marland Kitchen... Take 1 tablet by mouth once a day    Carvedilol 6.25 Mg Tabs (Carvedilol) .Marland Kitchen... Take 1 tablet by mouth two times a day  Complete Medication List: 1)  Lisinopril 40 Mg Tabs (Lisinopril) .... Take 1 tablet by mouth once a day 2)  Fenofibrate 160 Mg Tabs (Fenofibrate) .... Take 1 tablet by  mouth once a day 3)  Adult Aspirin Ec Low Strength 81 Mg Tbec (Aspirin) .... Take 1 tablet by mouth once a day 4)  Carvedilol 6.25 Mg Tabs (Carvedilol) .... Take 1 tablet by mouth two times a day 5)  Pravastatin Sodium 40 Mg Tabs (Pravastatin sodium) .... Take one by mouth daily 6)  Ventolin Hfa 108 (90 Base) Mcg/act Aers (Albuterol sulfate) .... 2 puffs q 4 hours as needed shortness of breath 7)  Diclofenac Sodium 75 Mg Tbec (Diclofenac sodium) .Marland Kitchen.. 1 tab by mouth two times a day 8)  Cyclobenzaprine Hcl 10 Mg Tabs (Cyclobenzaprine hcl) .Marland Kitchen.. 1 tab by mouth at bedtime as needed muscle spasm  Hypertension Assessment/Plan:      The patient's hypertensive risk group is category C: Target organ damage and/or diabetes.  Today's blood pressure is 122/60.  His blood pressure goal is < 140/90.  Lipid Assessment/Plan:      Based on NCEP/ATP III, the  patient's risk factor category is "history of coronary disease, peripheral vascular disease, cerebrovascular disease, or aortic aneurysm".  The patient's lipid goals are as follows: Total cholesterol goal is 200; LDL cholesterol goal is 100; HDL cholesterol goal is 40; Triglyceride goal is 150.  His LDL cholesterol goal has not been met.    Patient Instructions: 1)  Referral Appointment Information 2)  Day/Date: 3)  Time: 4)  Place/MD: 5)  Address: 6)  Phone/Fax: 7)  Patient given appointment information. Information/Orders faxed/mailed.  8)   Work on taking meds regularly..have family help remember where they are.  9)  Diclofenac 1 tab by mouth two times a day x 4-5 days regularly then as needed pain. 10)  Use flexeril for muscle spasm at night. 11)  We will call you with X-ray results. 12)  Follow up if neck pain not improving in 2 weeks. 13)  Please schedule a follow-up appointment in 6 months 30 min OV.  14)   Return stool cards.  15)  Look into shingles vaccine.Marland Kitchenask insurance if covered..if so make appt to get this.   Prescriptions: CYCLOBENZAPRINE HCL 10 MG TABS (CYCLOBENZAPRINE HCL) 1 tab by mouth at bedtime as needed muscle spasm  #20 x 0   Entered and Authorized by:   Kerby Nora MD   Signed by:   Kerby Nora MD on 01/25/2010   Method used:   Print then Give to Patient   RxID:   [id number] DICLOFENAC SODIUM 75 MG TBEC (DICLOFENAC SODIUM) 1 tab by mouth two times a day  #30 x 0   Entered and Authorized by:   Kerby Nora MD   Signed by:   Kerby Nora MD on 01/25/2010   Method used:   Electronically to        [facility] [phone]* (retail)       [address] [place], [place]  [address]       Ph: [phone] or [phone]       Fax: [phone]   RxID:   [id number]    Orders Added: 1)  Neurology Referral [Neuro] 2)  T-Cervical Spine Comp 4 Views [72050TC] 3)  Est. Patient Level IV [32951]    Current Allergies (reviewed today): No known allergies  Last Flu Vaccine:  Historical (03/07/2008 3:40:25 PM) Flu Vaccine Result Date:  01/05/2010 Flu Vaccine Result:  given Flu Vaccine Next Due:  1 yr Last Colonoscopy:  Normal (04/07/1980 3:21:40 PM) Colonoscopy Next Due:  Refused Last Hemoccult Result: normal (02/16/2009 5:16:56 PM) Hemoccult Next Due:  1 yr

## 2010-05-07 NOTE — Progress Notes (Signed)
Summary: RETURNING CALL   Phone Note Call from Patient Call back at Home Phone [phone]   Caller: SELF Call For: BENSIMHON Summary of Call: PT RETURNING ASHLEY'S CALL Initial call taken by: Harlon Flor,  February 04, 2010 4:19 PM  Follow-up for Phone Call        answered pt's questions Follow-up by: Benedict Needy, RN,  February 04, 2010 5:13 PM

## 2010-05-07 NOTE — Letter (Signed)
Summary: New Patient letter  [facility]  [address] [place], [place] [address]   Phone: [phone]  Fax: [phone]       05/22/2009 MRN: [id number]  Christian [name] [address] [place], [place]  [address]  Dear Christian [name],  Welcome to the Gastroenterology Division at [facility].    You are scheduled to see Dr. Melvia Heaps on June 12, 2009 at 10:45am on the 3rd floor at [facility], [address]  We ask that you try to arrive at our office 15 minutes prior to your appointment time to allow for check-in.  We would like you to complete the enclosed self-administered evaluation form prior to your visit and bring it with you on the day of your appointment.  We will review it with you.  Also, please bring a complete list of all your medications or, if you prefer, bring the medication bottles and we will list them.  Please bring your insurance card so that we may make a copy of it.  If your insurance requires a referral to see a specialist, please bring your referral form from your primary care physician.  Co-payments are due at the time of your visit and may be paid by cash, check or credit card.     Your office visit will consist of a consult with your physician (includes a physical exam), any laboratory testing he/she may order, scheduling of any necessary diagnostic testing (e.g. x-ray, ultrasound, CT-scan), and scheduling of a procedure (e.g. Endoscopy, Colonoscopy) if required.  Please allow enough time on your schedule to allow for any/all of these possibilities.    If you cannot keep your appointment, please call [phone] to cancel or reschedule prior to your appointment date.  This allows [place] the opportunity to schedule an appointment for another patient in need of care.  If you do not cancel or reschedule by 5 p.m. the business day prior to your appointment date, you will be charged a $50.00 late cancellation/no-show fee.    Thank you for choosing  [facility] for your medical needs.  We appreciate the opportunity to care for you.  Please visit [place] at our website  to learn more about our practice.                     Sincerely,                                                             The Gastroenterology Division

## 2010-05-07 NOTE — Assessment & Plan Note (Signed)
Summary: ROV/AMD      Allergies Added: NKDA  Primary Provider:  Kerby Nora MD   History of Present Illness: Christian [name] is a very pleasant 77 year old male with history of coronary artery disease, status post bypass surgery in 2009.  He has a normal ejection fraction.  PMH also notable for HTN, HL and COPD. Returns for routine f/u.  At last visit was having some CP when carrying wood up steps. Had cath 12/10   1. Three-vessel coronary artery disease.   2. Saphenous vein graft to the posterior descending artery is patent.   3. Left internal mammary artery to left anterior descending is patent.   4. Saphenous vein graft to the obtuse marginal system is totally       occluded ostially, but  there is only moderate coronary artery       disease in the native left circumflex.   5. Normal left ventricular function.   Conclusion: His revascularization is stable despite the loss of a  vein graft.  I am not sure what is causing his exertional symptoms.  We  will check an echocardiogram and PFTs. Proceed with medical therapy  PFTs showed small airway disease. Saw Dr. Ermalene Searing who started him on albuterol inhaler which has helped markedly. Still gets minimal CP if goes up and down 5x. Can push 200 lbs of wood in wheelbarrow without problem. No resting CP or HF symptoms.  Had echo EF 55-65%. Had carotid u/s this week results not back. No neruo symptoms.    Medications Prior to Update: 1)  Lisinopril 40 Mg Tabs (Lisinopril) .... Take 1 Tablet By Mouth Once A Day 2)  Fenofibrate 160 Mg  Tabs (Fenofibrate) .... Take 1 Tablet By Mouth Once A Day 3)  Adult Aspirin Ec Low Strength 81 Mg  Tbec (Aspirin) .... Take 1 Tablet By Mouth Once A Day 4)  Carvedilol 6.25 Mg Tabs (Carvedilol) .... Take 1 Tablet By Mouth Two Times A Day 5)  Fish Oil   Oil (Fish Oil) .... 1000mg  Once Daily -  On Hold 6)  Pravastatin Sodium 20 Mg Tabs (Pravastatin Sodium) .Marland Kitchen.. 1 Tab By Mouth Daily 7)  Plavix 75 Mg Tabs (Clopidogrel  Bisulfate) .... Take One Tablet By Mouth Daily 8)  Imdur 30 Mg Xr24h-Tab (Isosorbide Mononitrate) .Marland Kitchen.. 1 Tab By Mouth Daily 9)  Ventolin Hfa 108 (90 Base) Mcg/act Aers (Albuterol Sulfate) .... 2 Puffs As Needed For Wheezing  Current Medications (verified): 1)  Lisinopril 40 Mg Tabs (Lisinopril) .... Take 1 Tablet By Mouth Once A Day 2)  Fenofibrate 160 Mg  Tabs (Fenofibrate) .... Take 1 Tablet By Mouth Once A Day 3)  Adult Aspirin Ec Low Strength 81 Mg  Tbec (Aspirin) .... Take 1 Tablet By Mouth Once A Day 4)  Carvedilol 6.25 Mg Tabs (Carvedilol) .... Take 1 Tablet By Mouth Two Times A Day 5)  Pravastatin Sodium 20 Mg Tabs (Pravastatin Sodium) .Marland Kitchen.. 1 Tab By Mouth Daily 6)  Plavix 75 Mg Tabs (Clopidogrel Bisulfate) .... Take One Tablet By Mouth Daily 7)  Imdur 30 Mg Xr24h-Tab (Isosorbide Mononitrate) .Marland Kitchen.. 1 Tab By Mouth Daily  Allergies (verified): No Known Drug Allergies  Past History:  Past Medical History: Last updated: 03/23/2009 1. COPD 2. Hyperlipidemia 3. Hypertension 4. CAD, s/p CABG x 4 5. Carotid stenosis    a. u/s 7/10  R: 60-79% L 40-59%  Past Surgical History: Last updated: 04/25/2008  PFT's 2003      Thumb surgery - [facility]               Appendectomy                Prostate Surgery - for nodules,  benign 2005       Treadmill Stress Test - EF 49% (-) 2004       Hosp. 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   Vital Signs:  Patient profile:   77 year old male Height:      64 inches Weight:      143 pounds BMI:     24.63 Pulse rate:   56 / minute Pulse rhythm:   regular BP sitting:   122 / 60  (left arm) Cuff size:   regular  Vitals Entered By: Mercer Pod (May 03, 2009 10:17 AM)  Physical Exam  General:  General:  Gen: well appearing. no resp difficulty HEENT: normal Neck: supple. no JVD. Carotids 2+ bilat; R side bruit. No lymphadenopathy or thryomegaly appreciatted. Cor: PMI nondisplaced. Regular rate & rhythm. No rubs, murmur. +s4 Lungs: clear with decreased air movement Abdomen: soft, nontender, nondistended. Good bowel sounds. Extremities: no cyanosis, clubbing, rash, edema. gorin site no bruit or hematoma Neuro: alert & orientedx3, cranial nerves grossly intact. moves all 4 extremities w/o difficulty. affect pleasant     Impression & Recommendations:  Problem # 1:  CAD (ICD-414.00) Stable by recent cath. Continue medical therapy. Symtpoms much improved with inhalers.  Problem # 2:  CAROTID ARTERY STENOSIS (ICD-433.10) Stable by u/s. F/u 6-12 months. Continue asa and statin. Wants to stop Plavix due to cost. Given small incremental benefit, I think this is fine.

## 2010-05-07 NOTE — Letter (Signed)
Summary: Results Follow up Letter  [place] [facility]  [address] [place], [place] [address]   Phone: [phone]  Fax: [phone]    02/13/2010 MRN: [id number]    CAMRY THEISS [address] [place], [place]  [address]    Dear Mr. Hunzeker,  The following are the results of your recent test(s):  Test         Result    Pap Smear:        Normal _____  Not Normal _____ Comments: ______________________________________________________ Cholesterol: LDL(Bad cholesterol):         Your goal is less than:         HDL (Good cholesterol):       Your goal is more than: Comments:  ______________________________________________________ Mammogram:        Normal _____  Not Normal _____ Comments:  ___________________________________________________________________ Hemoccult:        Normal __X___  Not normal _______ Comments:  Yearly follow up is recommended.   _____________________________________________________________________ Other Tests:    We routinely do not discuss normal results over the telephone.  If you desire a copy of the results, or you have any questions about this information we can discuss them at your next office visit.   Sincerely,   Excell Seltzer, M.D.  AEB:lsf

## 2010-05-09 NOTE — Miscellaneous (Signed)
Summary: pt left prior to end of visit  can we try to reach pt to inform of meds sent to pharmacy?  treating as mild COPD exacerbation with steroids and abx.  please see if he has had steroids in past.  if not, let me know. Eustaquio Boyden  MD  April 12, 2010 1:51 PM   I got in touch with patient's son who would have his father contact the office. Kim Dance CMA Duncan Dull)  April 12, 2010 2:06 PM   Patient left a message with his new number. I called and got voicemail. I left a message informing him of meds that were sent into pharmacy and asked him to call me back and let me know about the steroids. Kim Dance CMA Duncan Dull)  April 12, 2010 3:37 PM   Patient never returned call. Kim Dance CMA Duncan Dull)  April 15, 2010 2:33 PM   Clinical Lists Changes

## 2010-05-09 NOTE — Assessment & Plan Note (Signed)
Summary: COLD/CLE   Vital Signs:  Patient profile:   77 year old male Weight:      145.50 pounds Temp:     98.8 degrees F oral Pulse rate:   74 / minute Pulse rhythm:   regular BP sitting:   122 / 60  (left arm) Cuff size:   regular  Vitals Entered By: Selena Batten Dance CMA (AAMA) (April 12, 2010 11:08 AM) CC: Cough/Cold   History of Present Illness: CC: cough/cold  3d h/o bad cold.  coughing yellow/green sputum.  chest congestion not nasal congestion.  + mild nausea.  appetite ok.  feeling bad.    No fevers/chills, abd pain, v/d, rashes, myalgias, arthralgias.  No HA.  h/o COPD.  + SOB, + increased cough and increased production.  has had several injuryis to nose throughout life.  told had sinus blockage in past.  no surgeries.  Quit smoking 12 years ago.    No sick contacts.  has had PNA and flu shot.  ANOTHER PT EMERGENCY OCCURED DURING VISIT.  PT WALKED OUT AROUND 11:40AM.  Current Medications (verified): 1)  Lisinopril 40 Mg Tabs (Lisinopril) .... Take 1 Tablet By Mouth Once A Day 2)  Fenofibrate 160 Mg  Tabs (Fenofibrate) .... Take 1 Tablet By Mouth Once A Day 3)  Adult Aspirin Ec Low Strength 81 Mg  Tbec (Aspirin) .... Take 1 Tablet By Mouth Once A Day 4)  Carvedilol 6.25 Mg Tabs (Carvedilol) .... Take 1 Tablet By Mouth Two Times A Day 5)  Pravastatin Sodium 40 Mg Tabs (Pravastatin Sodium) .... Take One By Mouth Daily 6)  Ventolin Hfa 108 (90 Base) Mcg/act Aers (Albuterol Sulfate) .... 2 Puffs Q 4 Hours As Needed Shortness of Breath  Allergies (verified): No Known Drug Allergies  Past History:  Past Medical History: Last updated: 03/23/2009 1. COPD 2. Hyperlipidemia 3. Hypertension 4. CAD, s/p CABG x 4 5. Carotid stenosis    a. 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   Impression & Recommendations:  Problem # 1:  COPD, MILD (ICD-496)  cough/congestion in setting of mild copd.  meets exacerbation criteria.  treat accordingly with abx.    unable to check lungs as patient walked out - likely would give steroid course as well, would like to check and see if pt has been on steroids in past.  tried to call, incorrect number in our system.  have asked billing to remove lvl 3 charge from chart.  His updated medication list for this problem includes:    Ventolin Hfa 108 (90 Base) Mcg/act Aers (Albuterol sulfate) .Marland Kitchen... 2 puffs q 4 hours as needed shortness of breath  Orders: No Charge Patient Arrived (NCPA0) (NCPA0)  Complete Medication List: 1)  Lisinopril 40 Mg Tabs (Lisinopril) .... Take 1 tablet by mouth once a day 2)  Fenofibrate 160 Mg Tabs (Fenofibrate) .... Take 1 tablet by mouth once a day 3)  Adult Aspirin Ec Low Strength 81 Mg Tbec (Aspirin) .... Take 1 tablet by mouth once a  day 4)  Carvedilol 6.25 Mg Tabs (Carvedilol) .... Take 1 tablet by mouth two times a day 5)  Pravastatin Sodium 40 Mg Tabs (Pravastatin sodium) .... Take one by mouth daily 6)  Ventolin Hfa 108 (90 Base) Mcg/act Aers (Albuterol sulfate) .... 2 puffs q 4 hours as needed shortness of breath 7)  Doxycycline Hyclate 100 Mg Caps (Doxycycline hyclate) .... One two times a day x 10 days 8)  Prednisone 20 Mg Tabs (Prednisone) .... 2 pills daily for 7 days Prescriptions: PREDNISONE 20 MG TABS (PREDNISONE) 2 pills daily for 7 days  #14 x 0   Entered and Authorized by:   Eustaquio Boyden  MD   Signed by:   Eustaquio Boyden  MD on 04/12/2010   Method used:   [facility]ron[facility] to        [facility] [phone]* (retail)        [address] [place], [place]  [address]       Ph: [phone] or [phone]       Fax: [phone]   RxID:   [id number] DOXYCYCLINE HYCLATE 100 MG CAPS (DOXYCYCLINE HYCLATE) one two times a day x 10 days  #20 x 0   Entered and Authorized by:   Eustaquio Boyden  MD   Signed by:   Eustaquio Boyden  MD on 04/12/2010   Method used:   Electronically to        [facility] [phone]* (retail)       [address] [place], [place]  [address]       Ph: [phone] or [phone]       Fax: [phone]   RxID:   [id number] VENTOLIN HFA 108 (90 BASE) MCG/ACT AERS (ALBUTEROL SULFATE) 2 puffs q 4 hours as needed shortness of breath  #1 x 2   Entered and Authorized by:   Eustaquio Boyden  MD   Signed by:   Eustaquio Boyden  MD on 04/12/2010   Method used:   [facility] to        [facility] [phone]* (retail)       [address] [place], [place]  [address]       Ph: [phone] or [phone]       Fax: [phone]   RxID:   [id number]    Orders Added: 1)  Est. Patient Level III [[address]] 2)  No Charge Patient Arrived (NCPA0) [NCPA0]    Current Allergies (reviewed today): No known allergies

## 2010-05-09 NOTE — Letter (Signed)
Summary: Patient Cancelled Appointment/[facility]  Patient Cancelled Appointment/[facility]   Imported By: Maryln Gottron 04/18/2010 12:53:11  _____________________________________________________________________  External Attachment:    Type:   Image     Comment:   External Document

## 2010-05-13 ENCOUNTER — Encounter: Payer: Self-pay | Admitting: Internal Medicine

## 2010-05-20 ENCOUNTER — Encounter: Payer: Self-pay | Admitting: Family Medicine

## 2010-05-23 ENCOUNTER — Encounter: Payer: Self-pay | Admitting: Family Medicine

## 2010-05-23 ENCOUNTER — Ambulatory Visit (INDEPENDENT_AMBULATORY_CARE_PROVIDER_SITE_OTHER): Payer: MEDICARE | Admitting: Family Medicine

## 2010-05-23 DIAGNOSIS — K5900 Constipation, unspecified: Secondary | ICD-10-CM

## 2010-05-23 DIAGNOSIS — I10 Essential (primary) hypertension: Secondary | ICD-10-CM

## 2010-05-23 DIAGNOSIS — I2510 Atherosclerotic heart disease of native coronary artery without angina pectoris: Secondary | ICD-10-CM

## 2010-05-29 NOTE — Assessment & Plan Note (Signed)
Summary: [name] follow up / [name] /alc   Vital Signs:  Patient profile:   77 year old male Height:      64 inches Weight:      144.50 pounds BMI:     24.89 Temp:     97.7 degrees F oral Pulse rate:   74 / minute Pulse rhythm:   regular BP sitting:   120 / 60  (left arm) Cuff size:   regular  Vitals Entered By: [name] CMA [name]) (May 23, 2010 9:29 AM)  History of Present Illness: Chief complaint [name] follow up [name] "HEART ATTACK"  77 year old male with history of  HTN, high choloesterol and CAD... was hospitalized  at [name] for MI  on 2/4 for 3-4 days.  Had started having chest pain the night of the superbowel. Woke up at night night with severe chest heaviness.. took ASA. Lives in [place] .. so went to [name] ER at 2 AM Dx wit MI... stent placed "behind heart "per pt.  Cardiologist is [name].. last saw on 01/2010 for routine follow up. Has follow up appt with [name] on 3/6.  Records requested but not recieved yet.   Since hospitalization.. he as been doing well. No chest pain, no SOB. Did have a muscle pull doing an exercise stretching in last week.. tender on lower left rib cage. Has noted bump at site of cath entry in right groin. No bleeding present. Mild tenderness at site. Per MDs said used new topical liquid bandage in groin.. had to hold in place for 15 minutes.  Problems Prior to Update: 1)  Dyspnea  (ICD-786.05) 2)  Neck Pain, Acute  (ICD-723.1) 3)  Wound, Hand  (ICD-882.0) 4)  Copd, Mild  (ICD-496) 5)  Carotid Artery Stenosis  (ICD-433.10) 6)  Memory Loss  (ICD-780.93) 7)  Constipation  (ICD-564.00) 8)  Cad  (ICD-414.00) 9)  Hypertension  (ICD-401.9) 10)  Hypercholesterolemia  (ICD-272.0) 11)  Dermatitis, Seborrheic  (ICD-690.10) 12)  Special Screening Malig Neoplasms Other Sites  (ICD-V76.49) 13)  Need Prophylactic Vaccination&inoculation Flu  (ICD-V04.81)  Current Medications (verified): 1)  Lisinopril 40 Mg Tabs (Lisinopril)  .... Take 1 Tablet By Mouth Once A Day 2)  Fenofibrate 160 Mg  Tabs (Fenofibrate) .... Take 1 Tablet By Mouth Once A Day 3)  Adult Aspirin Ec Low Strength 81 Mg  Tbec (Aspirin) .... Take 1 Tablet By Mouth Once A Day 4)  Carvedilol 6.25 Mg Tabs (Carvedilol) .... Take 1 Tablet By Mouth Two Times A Day 5)  Pravastatin Sodium 40 Mg Tabs (Pravastatin Sodium) .... Take One By Mouth Daily 6)  Plavix 75 Mg Tabs (Clopidogrel Bisulfate) .... Take One Tablet Dialy  Allergies (verified): No Known Drug Allergies  Past History:  Past medical, surgical, family and social histories (including risk factors) reviewed, and no changes noted (except as noted below).  Past Medical History: Reviewed history from 03/23/2009 and no changes required. 1. COPD 2. Hyperlipidemia 3. Hypertension 4. CAD, s/p CABG x 4 5. Carotid stenosis    a. u/s 7/10  R: 60-79% L 40-59%  Past Surgical History: Reviewed history from 04/25/2008 and no changes required.               PFT's 2003      Thumb surgery - [name]               Appendectomy                Prostate Surgery -  for nodules,  benign 2005       Treadmill Stress Test - EF 49% (-) 2004       Hosp. - weakness, dizzy (-) workup 2007       Carotid doppler (-) 2007       Kidney stone, lithotripsy 2009 CABG: [name]  Family History: Reviewed history from 02/02/2007 and no changes required. Father: Died 68, CAD, emphysema Mother: Died 71, CAD Siblings: 12 siblings, sister with emphysema (+) MI < 67, age 44 CV:  (+) CAD  Social History: Reviewed history from 02/02/2007 and no changes required. 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  Teeth in good repair. Neck:  ttp left latearl neck, no central vertebral ttp  muscle spasm, decrease ROM neg Spurling no cervical or supraclavicular lymphadenopathy  Lungs:  Normal respiratory effort, chest expands symmetrically. Lungs are clear to auscultation, no crackles or wheezes. Heart:  Normal rate and regular rhythm. S1 and S2 normal without gallop, murmur, click, rub or other extra sounds. Abdomen:  Bowel sounds positive,abdomen soft and non-tender without masses, organomegaly or hernias noted. Pulses:  R and L posterior tibial pulses are full and equal bilaterally  Extremities:  no edmea Skin:  healing contusion  right groin, small firm nodule at healed inscision.    Impression & Recommendations:  Problem # 1:  CAD (ICD-414.00) New MI.. s/p stent placement. Records requested from [name].Marland Kitchen not here yet. Small nodule at site of inscision at groin (where new hemostatic material placed)... apears well healing.. no hematoma, healing contusion.  Continue plavix.  Hold on return to work untill cleared by cards.   His updated medication list for this problem includes:    Lisinopril 40 Mg Tabs (Lisinopril) .Marland Kitchen... Take 1 tablet by mouth once a day    Adult Aspirin Ec Low Strength 81 Mg Tbec (Aspirin) .Marland Kitchen... Take 1 tablet by mouth once a day    Carvedilol 6.25 Mg Tabs (Carvedilol) .Marland Kitchen... Take 1 tablet by mouth two times a day    Plavix 75 Mg Tabs (Clopidogrel bisulfate) .Marland Kitchen... Take one tablet dialy  Problem # 2:  HYPERTENSION (ICD-401.9) Well controlled. Continue current medication.  His updated medication list for this problem includes:    Lisinopril 40 Mg Tabs (Lisinopril) .Marland Kitchen... Take 1 tablet by mouth once a day    Carvedilol 6.25 Mg Tabs (Carvedilol) .Marland Kitchen... Take 1 tablet by mouth two times a day  Problem # 3:  CONSTIPATION (ICD-564.00) HAs increased fiber and water in diet. Using miralax every other day...  still firmer stools. BM daily but pellets.  Complete Medication List: 1)  Lisinopril 40 Mg Tabs (Lisinopril) .... Take 1 tablet by mouth once a day 2)  Fenofibrate 160 Mg Tabs (Fenofibrate) .... Take 1 tablet by mouth once a day 3)  Adult Aspirin Ec Low Strength 81 Mg Tbec (Aspirin) .... Take 1 tablet by mouth once a day 4)  Carvedilol 6.25 Mg Tabs (Carvedilol) .... Take 1 tablet by mouth two times a day 5)  Pravastatin Sodium 40 Mg Tabs (Pravastatin sodium) .... Take one by mouth daily 6)  Plavix 75 Mg Tabs (Clopidogrel  bisulfate) .... Take one tablet dialy  Patient Instructions: 1)  Increase miralax to daily. 2)  Call if constipation not improving.  3)  No heavy lifting. Limit exertion at least until cleared by [name], Cardiology.  4)  Keep follow up with [name].   Orders Added: 1)  Est. Patient Level IV [[address]]    Current Allergies (reviewed today): No known allergies

## 2010-05-31 ENCOUNTER — Inpatient Hospital Stay: Payer: Self-pay | Admitting: Internal Medicine

## 2010-06-01 ENCOUNTER — Encounter: Payer: Self-pay | Admitting: Internal Medicine

## 2010-06-01 ENCOUNTER — Encounter: Payer: Self-pay | Admitting: Family Medicine

## 2010-06-04 NOTE — Letter (Signed)
Summary: [facility]   Imported By: Kassie Mends 05/29/2010 10:46:47  _____________________________________________________________________  External Attachment:    Type:   Image     Comment:   External Document

## 2010-06-11 ENCOUNTER — Ambulatory Visit (INDEPENDENT_AMBULATORY_CARE_PROVIDER_SITE_OTHER): Payer: MEDICARE | Admitting: Internal Medicine

## 2010-06-11 ENCOUNTER — Encounter: Payer: Self-pay | Admitting: Internal Medicine

## 2010-06-11 ENCOUNTER — Encounter: Payer: Self-pay | Admitting: Family Medicine

## 2010-06-11 DIAGNOSIS — I2510 Atherosclerotic heart disease of native coronary artery without angina pectoris: Secondary | ICD-10-CM

## 2010-06-11 DIAGNOSIS — E7800 Pure hypercholesterolemia, unspecified: Secondary | ICD-10-CM

## 2010-06-13 ENCOUNTER — Encounter (INDEPENDENT_AMBULATORY_CARE_PROVIDER_SITE_OTHER): Payer: Self-pay | Admitting: *Deleted

## 2010-06-13 ENCOUNTER — Other Ambulatory Visit: Payer: Self-pay | Admitting: Family Medicine

## 2010-06-13 ENCOUNTER — Other Ambulatory Visit (INDEPENDENT_AMBULATORY_CARE_PROVIDER_SITE_OTHER): Payer: MEDICARE

## 2010-06-13 DIAGNOSIS — I10 Essential (primary) hypertension: Secondary | ICD-10-CM

## 2010-06-13 LAB — BASIC METABOLIC PANEL
Calcium: 9.4 mg/dL (ref 8.4–10.5)
Creatinine, Ser: 1.4 mg/dL (ref 0.4–1.5)
GFR: 54.48 mL/min — ABNORMAL LOW (ref >60.00)
Sodium: 141 mEq/L (ref 135–145)

## 2010-06-18 ENCOUNTER — Encounter: Payer: Self-pay | Admitting: Family Medicine

## 2010-06-18 ENCOUNTER — Inpatient Hospital Stay: Payer: Self-pay | Admitting: Surgery

## 2010-06-18 NOTE — Letter (Signed)
Summary: [facility] Discharge Summary  [facility] Discharge Summary   Imported By: Kassie Mends 06/11/2010 09:15:07  _____________________________________________________________________  External Attachment:    Type:   Image     Comment:   External Document  Appended Document: [facility] Discharge Summary Please call pt.. he needs repeat blood work 1 week after discharge to reeval kidney function. Dx 401.1 BMET  Appended Document: [facility] Discharge Summary Appt made for tomorrow.Consuello Masse CMA

## 2010-06-18 NOTE — Assessment & Plan Note (Signed)
Summary: F4M/AMD  Medications Added ASPIRIN 325 MG TABS (ASPIRIN) 1 tablet once daily PRAVASTATIN SODIUM 20 MG TABS (PRAVASTATIN SODIUM) 1 tablet two times a day LIPITOR 40 MG TABS (ATORVASTATIN CALCIUM) Take one tablet by mouth daily.      Allergies Added: NKDA  Visit Type:  Initial Consult Primary Provider:  Kerby Nora MD  CC:  "doing well" denies chest pain and SOB.Marland Kitchen  History of Present Illness: [name] is a very pleasant 77 year old male with history of coronary artery disease, status post bypass surgery in 2009.  He has a normal ejection fraction.  PMH also notable for HTN, HL, carotid stenosis and COPD. Returns for routine f/u.  Cartoid u/s 1/11 R 60-79% L 40-59% stable  Returns for f/u. Admitted to [facility] in February 2012 with [place]. Underwen cath which Showed LAD T LCX OM-2 long 80% RCA totalled proximally. SVG-PDA was patent. LIMA patent. SVG-OM chronically occluded. Underwent PCI with Xience DES to OM-2. Admitted last week to [facility] with CP after eating an onion. ECG and CE normal.   Returns for f/u. Doing well. No further CP. Active with some chronic dyspnea. No change. No CHF. Compliant with all meds including Plavix. No problems with cath site.   Problems Prior to Update: 1)  Copd, Mild  (ICD-496) 2)  Carotid Artery Stenosis  (ICD-433.10) 3)  Memory Loss  (ICD-780.93) 4)  Constipation  (ICD-564.00) 5)  Cad  (ICD-414.00) 6)  Hypertension  (ICD-401.9) 7)  Hypercholesterolemia  (ICD-272.0) 8)  Dermatitis, Seborrheic  (ICD-690.10) 9)  Special Screening Malig Neoplasms Other Sites  (ICD-V76.49) 10)  Need Prophylactic Vaccination&inoculation Flu  (ICD-V04.81)  Medications Prior to Update: 1)  Lisinopril 40 Mg Tabs (Lisinopril) .... Take 1 Tablet By Mouth Once A Day 2)  Fenofibrate 160 Mg  Tabs (Fenofibrate) .... Take 1 Tablet By Mouth Once A Day 3)  Adult Aspirin Ec Low Strength 81 Mg  Tbec (Aspirin) .... Take 1 Tablet By Mouth Once A Day 4)  Carvedilol 6.25 Mg Tabs  (Carvedilol) .... Take 1 Tablet By Mouth Two Times A Day 5)  Pravastatin Sodium 40 Mg Tabs (Pravastatin Sodium) .... Take One By Mouth Daily 6)  Plavix 75 Mg Tabs (Clopidogrel Bisulfate) .... Take One Tablet Dialy  Current Medications (verified): 1)  Lisinopril 40 Mg Tabs (Lisinopril) .... Take 1 Tablet By Mouth Once A Day 2)  Fenofibrate 160 Mg  Tabs (Fenofibrate) .... Take 1 Tablet By Mouth Once A Day 3)  Aspirin 325 Mg Tabs (Aspirin) .Marland Kitchen.. 1 Tablet Once Daily 4)  Carvedilol 6.25 Mg Tabs (Carvedilol) .... Take 1 Tablet By Mouth Two Times A Day 5)  Pravastatin Sodium 20 Mg Tabs (Pravastatin Sodium) .Marland Kitchen.. 1 Tablet Two Times A Day 6)  Plavix 75 Mg Tabs (Clopidogrel Bisulfate) .... Take One Tablet Dialy  Allergies (verified): No Known Drug Allergies  Past History:  Past Medical History: Last updated: 03/23/2009 1. COPD 2. Hyperlipidemia 3. Hypertension 4. CAD, s/p CABG x 4 5. Carotid stenosis    a. u/s 7/10  R: 60-79% L 40-59%  Past Surgical History: Last updated: 04/25/2008               PFT's 2003      Thumb surgery - [facility]               Appendectomy                Prostate Surgery - for nodules,  benign 2005       Treadmill Stress  Test - EF 49% (-) 2004       Hosp. 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 Father: Died 17, CAD, emphysema Mother: Died 99, CAD Siblings: 12 siblings, sister with emphysema (+) MI < 72, age 65 CV:  (+) CAD  Social History: Reviewed history from Feb 09, 2007 and no changes required. 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   Vital Signs:  Patient profile:   77 year old male Height:      64 inches Weight:      146.8 pounds BMI:     25.29 Pulse rate:   56 / minute BP sitting:   118 / 72  (left arm) Cuff size:   regular  Vitals Entered By: Lysbeth Galas CMA (June 11, 2010 4:58 PM)  Physical Exam  General:  well appearing. no resp difficulty HEENT: normal Neck.no JVD. Carotids 2+ bilat; R side bruit. No lymphadenopathy or thryomegaly appreciatted. Cor: PMI nondisplaced. Regular rate & rhythm. No rubs, murmur. +s4 Lungs: clear with decreased air movement Abdomen: soft, nontender, nondistended. Good bowel sounds. Extremities: no cyanosis, clubbing, rash, edema.  Neuro: alert & orientedx3, cranial nerves grossly intact. moves all 4 extremities w/o difficulty. affect pleasant    Impression & Recommendations:  Problem # 1:  CAD (ICD-414.00) Doing well s/p PCI. Continue Plavix x 1 year. Refuses cardiac rehab.  We discussed exercise program he can do at gym including 20-30 minutes of aerobic exercise at lest 3x/week and circuit training with light weights and no isometrics.   Problem # 2:  CAROTID ARTERY STENOSIS (ICD-433.10) Asymptomatic. Will schedule f/u u/s at next  visit.   Problem # 3:  HYPERCHOLESTEROLEMIA (ICD-272.0) LDL not at goal. Will switch prava to atorvastatin 40 once daily. Will stop fenofibrate. Repeat lipids in 1 month and adjust as needed. Goal LDL < 70.   Patient Instructions: 1)  Your physician recommends that you schedule a follow-up appointment in: 4 months 2)  Your physician recommends that you return for a FASTING lipid profile: (Lipid/LFT) 07/09/10 @ 8:30am 3)  Your physician has recommended you make the following change in your medication: STOP Pravastatin. STOP Fenofibrate. START Lipitor 40mg  once daily. Prescriptions: LIPITOR 40 MG TABS (ATORVASTATIN CALCIUM) Take one tablet by mouth daily.  #30 x 6   Entered by:   Lanny Hurst RN   Authorized by:  Dolores Patty, MD, [facility]   Signed by:   Lanny Hurst RN on 06/11/2010   Method used:   [facility] to        [facility] [phone][facility] (retail)       [address] [place], [place]  [address]       Ph: [phone] or [phone]       Fax: [phone]   RxID:   [id number]

## 2010-06-25 NOTE — Letter (Signed)
[facility]   Imported By: Roderic Ovens 06/17/2010 15:43:23  _____________________________________________________________________  External Attachment:    Type:   Image     Comment:   External Document

## 2010-06-27 ENCOUNTER — Other Ambulatory Visit: Payer: Self-pay | Admitting: Family Medicine

## 2010-06-27 ENCOUNTER — Other Ambulatory Visit: Payer: Self-pay | Admitting: Internal Medicine

## 2010-07-04 NOTE — Letter (Signed)
Summary: [facility]   Imported By: Kassie Mends 06/24/2010 08:45:56  _____________________________________________________________________  External Attachment:    Type:   Image     Comment:   External Document

## 2010-07-04 NOTE — Telephone Encounter (Signed)
[address] °

## 2010-07-09 ENCOUNTER — Other Ambulatory Visit: Payer: Self-pay | Admitting: Internal Medicine

## 2010-07-09 ENCOUNTER — Other Ambulatory Visit (INDEPENDENT_AMBULATORY_CARE_PROVIDER_SITE_OTHER): Payer: MEDICARE | Admitting: *Deleted

## 2010-07-09 DIAGNOSIS — E7800 Pure hypercholesterolemia, unspecified: Secondary | ICD-10-CM

## 2010-07-10 LAB — LIPID PANEL
HDL: 46 mg/dL (ref >39)
Triglycerides: 66 mg/dL (ref <150)

## 2010-07-10 LAB — HEPATIC FUNCTION PANEL
AST: 17 U/L (ref 0–37)
Albumin: 3.9 g/dL (ref 3.5–5.2)
Total Bilirubin: 0.4 mg/dL (ref 0.3–1.2)

## 2010-07-12 ENCOUNTER — Encounter: Payer: Self-pay | Admitting: Cardiovascular Disease

## 2010-08-02 ENCOUNTER — Encounter: Payer: Self-pay | Admitting: Family Medicine

## 2010-08-02 ENCOUNTER — Ambulatory Visit (INDEPENDENT_AMBULATORY_CARE_PROVIDER_SITE_OTHER): Payer: MEDICARE | Admitting: Family Medicine

## 2010-08-02 DIAGNOSIS — I6529 Occlusion and stenosis of unspecified carotid artery: Secondary | ICD-10-CM

## 2010-08-02 DIAGNOSIS — I10 Essential (primary) hypertension: Secondary | ICD-10-CM

## 2010-08-02 DIAGNOSIS — I2510 Atherosclerotic heart disease of native coronary artery without angina pectoris: Secondary | ICD-10-CM

## 2010-08-02 DIAGNOSIS — Z8719 Personal history of other diseases of the digestive system: Secondary | ICD-10-CM

## 2010-08-02 DIAGNOSIS — E7800 Pure hypercholesterolemia, unspecified: Secondary | ICD-10-CM

## 2010-08-02 NOTE — Assessment & Plan Note (Signed)
Well controlled. Continue current medication.  

## 2010-08-02 NOTE — Patient Instructions (Signed)
For gallstones: Avoid fat in diet, fried foods. If pain in right upper abdomen last >3 hours, fever and vomiting.

## 2010-08-02 NOTE — Assessment & Plan Note (Signed)
Stable

## 2010-08-02 NOTE — Assessment & Plan Note (Signed)
No emergent symptoms.. Discussed with pt s/s of cholecytitis and red flags on when to go to ER. Plan surgery for cholecystectomy elective in 1 year.

## 2010-08-02 NOTE — Assessment & Plan Note (Signed)
Improved on lipitor.Marland Kitchen Devra Dopp

## 2010-08-02 NOTE — Progress Notes (Signed)
  Subjective:    Patient ID: Christian [name], male    DOB: [birth year], 77 y.o.   MRN: [id number]  HPI  77 year old male with history of HTN, high choloesterol and CAD... was hospitalized  at [facility] for MI on 2/4 for 3-4 days.  Had started having chest pain the night of the superbowel. Woke up at night night with severe chest heaviness.. took ASA.  Lives in [place] .. so went to [facility] at 2 AM  Dx with MI... stent placed.  Had affitional hosp admission for chest pain and 3rd ER visit  Cardiologist is Dr. Elray Mcgregor.. last saw on 06/2010...  Cholesterol was not at goal....switched prava to atorvastatin 40 once daily. Stopped fenofibrate. Planned repeat lipids in 1 month and adjust as needed. Goal LDL < 70.  Otherwise stable from cardiac perspective.. No further eval done. Planned carotid US follow up at next appt.  No chest pain, no SOB since early 06/2010  On 4/3 cholesterol reeval.. Showed.. LDL almost to goal <70.  Stable liver function. Pt is without SE.   HTN, well controlled on current meds.   Of note when he was in hospital at last ER visit.. Noted gallstones on [place] at [facility]... No current abdominal pain. Pt reports he temporarily stopped plavix because he thought he should to have gallbladder surgery. Recommended waiting 1 year after being on plavix before removing.       Review of Systems  Constitutional: Negative for fever, fatigue and unexpected weight change.  HENT: Negative for ear pain, congestion, sore throat, rhinorrhea, trouble swallowing and postnasal drip.   Eyes: Negative for pain.  Respiratory: Negative for cough, shortness of breath and wheezing.   Cardiovascular: Negative for chest pain, palpitations and leg swelling.  Gastrointestinal: Negative for nausea, abdominal pain, diarrhea, constipation and blood in stool.  Genitourinary: Negative for dysuria, hematuria and difficulty urinating.  Skin: Negative for rash.  Psychiatric/Behavioral: Negative for  behavioral problems and dysphoric mood. The patient is not nervous/anxious.        Objective:   Physical Exam  Constitutional: Vital signs are normal. He appears well-developed and well-nourished.  HENT:  Head: Normocephalic.  Right Ear: Hearing normal.  Left Ear: Hearing normal.  Nose: Nose normal.  Mouth/Throat: Oropharynx is clear and moist and mucous membranes are normal.  Neck: Trachea normal. Carotid bruit is not present. No mass and no thyromegaly present.  Cardiovascular: Normal rate, regular rhythm and normal pulses.  Exam reveals no gallop, no distant heart sounds and no friction rub.   No murmur heard.      No peripheral edema  Pulmonary/Chest: Effort normal and breath sounds normal. No respiratory distress.  Skin: Skin is warm, dry and intact. No rash noted.  Psychiatric: He has a normal mood and affect. His speech is normal and behavior is normal. Thought content normal.          Assessment & Plan:

## 2010-08-02 NOTE — Assessment & Plan Note (Signed)
Carotid dopplers followed by Dr. Romeo Apple.

## 2010-08-07 ENCOUNTER — Emergency Department: Payer: Self-pay | Admitting: Emergency Medicine

## 2010-08-12 ENCOUNTER — Other Ambulatory Visit: Payer: Self-pay | Admitting: Internal Medicine

## 2010-08-12 MED ORDER — CLOPIDOGREL BISULFATE 75 MG PO TABS: 75.0000 mg | ORAL_TABLET | Freq: Every day | ORAL | Status: DC

## 2010-08-12 NOTE — Telephone Encounter (Signed)
The patient states pharmacy never received the Rx refill on plavix 75 mg.  There will show duplicate refills.

## 2010-08-12 NOTE — Telephone Encounter (Signed)
Pt states that he has been out of medication for 5 days and that the pharmacy has contacted [place] about this.  Would like to have refills on the medication so that he does not have to call here every month.  Please advise.

## 2010-08-13 ENCOUNTER — Telehealth: Payer: Self-pay | Admitting: Internal Medicine

## 2010-08-13 MED ORDER — CARVEDILOL 6.25 MG PO TABS: ORAL_TABLET | ORAL | Status: DC

## 2010-08-13 NOTE — Telephone Encounter (Signed)
[facility] called and needs new pres for Carvedilol 6.25mg  #60

## 2010-08-15 ENCOUNTER — Telehealth: Payer: Self-pay

## 2010-08-15 NOTE — Telephone Encounter (Signed)
Would involve cardiology at [facility] who placed stent. Non-urgent - AEB in AM to eval

## 2010-08-15 NOTE — Telephone Encounter (Signed)
Pt placed on Plavix after stents put in 08/2010 at [facility]. Since 05/2010 pt has had 5 gallbladder attacks. Pt said has 5-6 gallstones and needs surgery but cannot have surgery because on Plavix. Was told by a dr (pt not sure which dr) that he would need to stay on Plavix for 1 year. Pt states he cannot wait til Feb to have surgery.  Dr Katrinka Blazing surgeon at [facility]  Told pt he would do surgery if pt can stop Plavix. Please advise. [facility] uses [facility] if pharmacy needed. Pt  Can be reached at [phone]. Pt understands Dr Ermalene Searing will return to office 08/16/10.

## 2010-08-16 NOTE — Telephone Encounter (Signed)
Agree..the answer about plavix needs to come from his cardiologist. Please refer surgeon to Dr. Toni Amend

## 2010-08-20 ENCOUNTER — Telehealth: Payer: Self-pay | Admitting: Emergency Medicine

## 2010-08-20 NOTE — Cardiovascular Report (Signed)
NAME:  Christian [name], Christian [name] NO.:  [id number]   MEDICAL RECORD NO.:  [id number]          PATIENT TYPE:  OIB   LOCATION:  1962                         FACILITY:  [facility]   PHYSICIAN:  Rollene Rotunda, MD, FACCDATE OF BIRTH:  04-22-1931   DATE OF PROCEDURE:  08/24/2007  DATE OF DISCHARGE:  08/24/2007                            CARDIAC CATHETERIZATION   PRIMARY CARE PHYSICIAN:  Kerby Nora, MD   PROCEDURE:  1. Left heart catheterization.  2. Selective coronary arteriography.   INDICATIONS:  The patient with chest pain suggestive of unstable angina.   PROCEDURE NOTE:  Left heart catheterization performed via right femoral  artery, was cannulated using the anterior wall puncture.  A #4-French  arterial sheath was inserted via the Seldinger technique.  Preformed  Judkins and pigtail catheter utilized.  The patient tolerated procedure  well and left the lab in stable condition.   RESULTS:  Hemodynamics LV 149/18, AL 143/80.  Coronaries of left main  was heavily calcified with diffuse 50% stenosis.  The LAD was heavily  calcified in the ostial and proximal segment.  There was an ostial 95%  stenosis.  The LAD was a large vessel wrapping the apex.  There is  proximal long, heavily calcified 40% stenosis.  There was a mid long 30%  stenosis.  First diagonal was very small.  Second diagonal was small  with ostial 80% stenosis.  The circumflex had diffuse luminal  irregularities in the proximal AV groove.  There is mid 50% stenosis at  the second diagonal.  First obtuse marginal was moderate sized with mid  40% stenosis.  Second obtuse marginal was moderate sized with long 60%  stenosis.  Posterolateral was moderate sized with luminal  irregularities.  The right coronary artery was dominant vessel.  There  was scant left-to-right filling.  It was occluded proximally.  Left  ventriculogram, the left ventriculogram  was obtained in the RAO  projection.  The EF was 65% with normal  wall motion.   CONCLUSION:  Severe two-vessel coronary artery disease with well-  preserved ejection fraction.   PLAN:  The patient will have CABG.      Rollene Rotunda, MD, [facility]  Electronically Signed     JH/MEDQ  D:  08/24/2007  T:  08/24/2007  Job:  [id number]   cc:   Kerby Nora, MD

## 2010-08-20 NOTE — Assessment & Plan Note (Signed)
[facility] OFFICE NOTE   NAME:Christian [name]                      MRN:          [id number]  DATE:09/10/2007                            DOB:          05-Apr-1932    PRIMARY CARE PHYSICIAN:  Kerby Nora, M.D.   REASON FOR PROCEDURE:  Status post recent CABG.   HISTORY OF PRESENT ILLNESS:  The patient is a pleasant 77 year old  gentleman who presented with exertional angina.  He had a cardiac  catheterization demonstrating left main was calcified with 50% stenosis,  the LAD and osteal 95% stenosis, the second diagonal small osteal 80%  stenosis.  The  circumflex had 50% stenosis of the second diagonal.  Mid  obtuse marginal had 40% stenosis.  Second obtuse marginal 60% stenosis.  The right coronary artery is dominant and occluded proximally.  The EF  is 65%.  The patient subsequently underwent CABG with a LIMA to the LAD,  sequential SVG to first and second obtuse marginal and saphenous vein  graft to the PDA.  The patient did very well postoperatively.   He presents and says he has had no chest discomfort, cough, fevers or  chills.  Had no shortness of breath.  However, he has had a left ankle  pain.  This is very tender to touch.  Has had some swelling.  He does  not have pain in his calf or tenderness or swelling in his calf.  It  seems to be at the medial aspect of his ankle.  He has some numbness in  this area.  There is very slight ecchymosis.  He did not have any trauma  to this.  He is able walk on it, but it is tender to touch.  He is  currently not taking pain medications.   PAST MEDICAL HISTORY:  1. Coronary artery disease as described.  2. Hyperlipidemia.  3. Hypertension.  4. Nephrolithiasis.  5. COPD.  6. Thumb surgery.  7. Tonsillectomy.  8. Circumcision.  9. Appendectomy.   ALLERGIES:  None.   MEDICATIONS:  1. Lisinopril 4 mg daily.  2. Fenofibrate 160 mg daily.  3. Aspirin 325 mg  daily.  4. Metoprolol 25 mg b.i.d.  5. Crestor 10 mg daily.   REVIEW OF SYSTEMS:  As stated in HPI, otherwise negative for other  systems.   PHYSICAL EXAMINATION:  GENERAL:  The patient is in no distress.  VITAL SIGNS:  Blood pressure 132/60, heart rate 56 and regular, weight  140 pounds.  HEENT:  Pupils equal and reactive to light.  Fundi not visualized.  Oral  mucosa normal.  NECK:  No jugular distention at 45 degrees.  Carotid upstroke brisk and  symmetrical.  No bruits, thyromegaly.  LYMPHATICS:  No cervical, axillary, inguinal adenopathy.  LUNGS:  Clear to auscultation bilaterally.  BACK:  No costovertebral angle tenderness.  CHEST:  Well-healed sternotomy scar.  HEART:  PMI not displaced or sustained.  S1 and S2 within normal.  No S3  or S4.  No clicks, rubs, murmurs.  ABDOMEN:  Mildly obese, positive bowel sounds.  Normal  in frequency and  pitch.  No bruits, rebound, guarding, or midline pulsatile mass.  No  hepatomegaly or splenomegaly.  SKIN:  No rashes, no nodules.  EXTREMITIES:  2+ pulses throughout.  The left ankle is mildly swollen  and tender to touch, particularly in the medial malleolus.  There is no  calf tenderness or swelling.  He had excellent distal pulses and  capillary refill.  NEURO:  Oriented to person, place and time.  Cranial nerves II-XII  grossly intact.  Motor grossly intact.   EKG sinus rhythm, rate 56.  Axis within normal limits, intervals within  normal limits, no acute ST wave change.   ASSESSMENT/PLAN:  1. Coronary disease, status post coronary artery bypass grafting.  The      patient is doing extremely well from this standpoint.  He will      participate in cardiac  rehab at [facility].  No further      cardiovascular testing is plan.  He will continue with aggressive      risk reduction.  2. Dyslipidemia and 8 weeks he will get a liver profile and lipids.      We will make adjustments for target LDL less than 100, HDL greater      than  40.  3. Ankle pain.  This is a predominant problem.  I may get ankle films      to see if there is any bony abnormalities.  I would not expect      clotting or deep venous thrombosis.  4. Hypertension.  Blood pressure is very slightly elevated.  He will      continue the medications as listed.  We will watch this over time      and make adjustments as needed.   FOLLOWUP:  Will see him back in about 8 weeks or sooner if needed.  He  is going to see Dr. Laneta Simmers next week.Rollene Rotunda, MD, [facility]  Electronically Signed    JH/MedQ  DD: 09/10/2007  DT: 09/10/2007  Job #: [id number]   cc:   Kerby Nora, MD

## 2010-08-20 NOTE — Assessment & Plan Note (Signed)
[facility] OFFICE NOTE   NAME:Christian [name], Christian [name]                      MRN:          [id number]  DATE:08/17/2007                            DOB:          06/11/1931    PRIMARY CARE PHYSICIAN:  Kerby Nora, MD.   REASON FOR CONSULTATION:  Evaluate patient with chest pain.   HISTORY OF PRESENT ILLNESS:  The patient is a pleasant 77 year old white  gentleman without prior cardiac history.  He said he had a stress test  about 8 years ago that was normal.  He has been having chest  discomfort for about 4 months.  He is noticing this with exertion.  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  This only happened after about the second or third trip.  It would be  severe at times, perhaps 8/10.  It was a chest tightness radiating  across his chest.  It did not go to his throat or his jaw.  He would get  short of breath.  He would have to stop what he was doing and it would  go away.  He never had this before.  It has become progressive and is  now limiting him at a lower level of activity.  He has not had any at  rest.  He does not get associated nausea, vomiting or diaphoresis.  He  has not had jaw or arm discomfort.   PAST MEDICAL HISTORY:  1. Hyperlipidemia x2 years.  2. Hypertension x a couple of years.  3. Nephrolithiasis.  4. COPD.   PAST SURGICAL HISTORY:  1. Thumb surgery.  2. Tonsillectomy.  3. Circumcision.  4. Appendectomy.   ALLERGIES:  NONE.   MEDICATIONS:  1. Lisinopril 40 mg daily.  2. Fenofibrate 160 mg daily.  3. Simvastatin 40 mg daily.  4. All Clear eye drops.  5. Metoprolol 12.5 mg b.i.d.  6. Aspirin 81 mg daily.   SOCIAL HISTORY:  The patient is retired.  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  He is a widow.  He has 5 children.  He does have a male friend he  spends a lot of time with.  He  quit smoking 9 years ago after smoking  for 55 years.   FAMILY HISTORY:  Very positive for coronary disease in all of his  siblings (there are 67 of them).  They seem to start in the boys in the  late 50s early 60s and in the girls in their 60-70s.  He had one  brother who had four bypass surgeries.   REVIEW OF SYSTEMS:  As stated in the HPI and is negative for other  systems.   PHYSICAL EXAMINATION:  GENERAL:  The patient is well-appearing in no  distress.  VITAL SIGNS:  Blood pressure 156/75, heart rate 73 and regular.  Weight  146 pounds.  HEENT:  Eyes are unremarkable.  Pupils are equal, round and reactive to  light.  Fundi within normal limits.  Oral mucosa unremarkable,  edentulous.  NECK:  No jugular venous distention at 45 degrees.  Carotid upstroke  brisk and symmetric, right carotid bruit, no thyromegaly.  LYMPHATICS:  No cervical, axillary or inguinal adenopathy.  LUNGS:  Clear to auscultation bilaterally.  BACK:  No costovertebral angle tenderness.  CHEST:  Unremarkable.  HEART:  PMI not displaced or sustained, S1-S2 within normal limits, no  S3, S4, no clicks, rubs, murmurs.  ABDOMEN:  Flat, positive bowel sounds normal in frequency and pitch, no  bruits, rebound, guarding, no midline pulsatile mass.  No hepatomegaly  or splenomegaly.  SKIN:  No rashes, no nodules.  EXTREMITIES:  2+ pulses throughout, no edema, cyanosis or clubbing.  NEURO:  Oriented to place, time, cranial nerves II-XII are grossly  intact, motor grossly intact throughout.   DIAGNOSTICS:  EKG (done at Dr. Daphine Deutscher office) sinus bradycardia, rate  52, axis within normal limits, intervals within normal limits, no acute  ST/T wave changes.   ASSESSMENT/PLAN:  1. Chest:  The patient has exertional chest pain that has become      progressive.  It is consistent with angina.  As it is increasing,      it would be an unstable pattern.  He has multiple cardiovascular      risk factors.  He has a very  strong family history of coronary      disease.  I think the pretest probability of obstructive coronary      disease is very high.  At this point, I do not think stress      perfusion imaging is warranted as it is likely to be positive or if      negative, there is a high possibility that it would be a false      negative.  Therefore, we should proceed directly with cardiac      catheterization.  I reviewed this procedure in detail.  I outlined      the risk of stroke, death, heart attack, vascular trauma, embolism,      renal insufficiency, dye allergy, infection, bleeding, groin      complications and other.  He understands and agrees to proceed.  We      will do this electively in the [facility] cath lab.  He is not going to do      any heavy work until that time.  He knows to present to the      emergency room should he get any symptoms at rest or increased      symptoms.  2. Hypertension.  Blood pressure is slightly elevated, but he was      given a prescription for metoprolol which he did not yet start.  I      have encouraged him to start this.  We will address this as needed.  3. Dyslipidemia.  We will get fasting lipid profile, liver enzymes and      treat accordingly.  4. Chronic obstructive pulmonary disease.  This could be contributing.      We would evaluate this if there is no clear cardiac etiology to his      complaints.   FOLLOWUP:  Will be at the time of his catheterization.     Rollene Rotunda, MD, [facility]  Electronically Signed    JH/MedQ  DD: 08/17/2007  DT: 08/17/2007  Job #: [id number]   cc:   Kerby Nora, MD

## 2010-08-20 NOTE — Consult Note (Signed)
NAME:  Christian [name], AUTRY NO.:  [id number]   MEDICAL RECORD NO.:  [id number]          PATIENT TYPE:  INP   LOCATION:  4735                         FACILITY:  [facility]   PHYSICIAN:  Evelene Croon, M.D.     DATE OF BIRTH:  [birth year]   DATE OF CONSULTATION:  DATE OF DISCHARGE:                                 CONSULTATION   REASON FOR CONSULTATION:  Three-vessel coronary disease with unstable  angina.   CLINICAL HISTORY:  I have been asked by Dr. Antoine Poche to evaluate Mr.  [name] for consideration of coronary artery bypass graft surgery.  He is  an active 77 year old gentleman with a strong family history of heart  disease and history of hyperlipidemia and hypertension, as well as a  history of heavy remote smoking who presented with a 36-month history of  progressively worsening chest pressure and shortness of breath with  exertion.  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  He said he frequently carries a box of tile weighing 40 pounds  a piece and has noted that over the past few months, he has been able to  carry less and less without getting chest tightness.  He describes this  as being across his upper chest without radiation and has been  associated with shortness of breath.  When he sits down and relaxes, the  pressure goes away.  He had a previous stress test about 8 years ago,  that was normal.  He was seen by Dr. Antoine Poche and underwent cardiac  catheterization today given his risk factors.  This showed about 50%  calcified left main stenosis.  The LAD had an ostial 95% stenosis and  long proximal 40% heavily calcified stenosis.  There was a small second  diagonal to have an ostial 80% stenosis.  The left circumflex was  diffusely of disease throughout the AV groove portion with about 50% mid  vessel stenosis.  There was moderate-sized first margin with 40% mid  vessel stenosis and moderate-sized second marginal with 60% long  stenosis.   The right coronary artery was dominant vessel and was  occluded proximally.  Ejection fraction was about 65%.   REVIEW OF SYSTEMS:  As follows.  GENERAL:  He has had no fever or  chills.  VITAL SIGNS:  He has had no recent weight changes.  He does  have some recent fatigue.  EYES:  Negative.  ENT:  Negative.  ENDOCRINE:  He denies diabetes and hypothyroidism.  CARDIOVASCULAR:  As above.  He denies PND and orthopnea.  EXTREMITIES:  He has had no peripheral edema or palpitations.  RESPIRATORY:  He denies  cough or sputum production.  GASTROINTESTINAL:  He has had no nausea and  vomiting.  He denies melena or blood per rectum.  GU:  He denies dysuria  and hematuria.  MUSCULOSKELETAL:  He denies arthralgias or myalgias.  VASCULAR:  He denies claudication or phlebitis.  NEUROLOGICAL:  He has  had no focal weakness or numbness.  He denies dizziness or syncope.  He  has never had TIA or  stroke.  PSYCHIATRIC:  Negative.   ALLERGIES:  None.   PAST MEDICAL HISTORY:  Significant for:  1. Hypertension.  2. Hyperlipidemia.  3. He has a history of kidney stones.  4. He is status post appendectomy at age 80.  5. He is status post tonsillectomy.  6. He is status post circumcision.  7. He is status post reconstruction of his left hand after cutting his      left thumb off with a saw.   MEDICATIONS:  Prior to admission were:  1. Lisinopril 40 mg daily.  2. Fenofibrate 160 mg daily.  3. Simvastatin 40 mg daily.  4. Eye drops daily.  5. Lopressor 12.5 mg b.i.d.  6. Aspirin 81 mg daily.   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  He is  widowed.  He had 5 children.  He quit smoking about 9 years ago after  smoking for 55 years.  He denies alcohol abuse.   FAMILY HISTORY:  Very positive for cardiac disease.  He had 11 siblings  and essentially all of them had heart disease in their 4s and 65s.   PHYSICAL EXAMINATION:  VITAL SIGNS:  His blood pressure is 141/56, his  pulse  is 61 and regular, and respiratory rate is 16 and unlabored.  GENERAL:  He is a well-developed white male, in no distress.  HEENT:  Shows him to be normocephalic and atraumatic.  Pupils are equal  and reactive to light.  Extraocular muscles are intact.  His throat is  clear.  NECK:  Shows normal carotid pulses bilaterally.  There is a right  cervical bruit.  There is no adenopathy or thyromegaly.  CARDIAC:  Shows a regular rate and rhythm with normal S1 and S2.  There  is no murmur, rub, or gallop.  LUNGS:  Clear.  ABDOMEN:  Shows active bowel sounds.  His abdomen is soft, flat, and  nontender.  There are no palpable masses or organomegaly.  EXTREMITIES:  Shows no peripheral edema.  Pedal pulses are palpable  bilaterally.  SKIN:  Warm and dry.  NEUROLOGIC:  Shows him to be alert, oriented x3.  Motor and sensory exam  is grossly normal.   Carotid Doppler on admission has been completed and is pending.   LABORATORY EXAMINATIONS:  Pending.   IMPRESSION:  Christian [name] has left main and severe multivessel coronary  disease with progressive anginal symptoms.  I agree that coronary artery  bypass graft surgery is the best treatment for the further ischemia and  infarction, and improve his quality of life.  I discussed the operative  procedure with him including alternatives, benefits, and risks,  including but not limited to bleeding, blood transfusion, infection,  stroke, myocardial infarction, graft failure, and death.  He understands  and agrees to proceed.  We will plan to do this tomorrow on Aug 25, 2007.      Evelene Croon, M.D.  Electronically Signed     BB/MEDQ  D:  08/24/2007  T:  08/25/2007  Job:  [id number]   cc:   Kerby Nora, MD

## 2010-08-20 NOTE — Op Note (Signed)
NAME:  Christian [name], Christian [name] NO.:  [id number]   MEDICAL RECORD NO.:  [id number]           PATIENT TYPE:   LOCATION:                                 FACILITY:   PHYSICIAN:  Evelene Croon, M.D.     DATE OF BIRTH:  October 12, 1931   DATE OF PROCEDURE:  08/25/2007  DATE OF DISCHARGE:                               OPERATIVE REPORT   POSTOPERATIVE DIAGNOSIS:  Left main and 3-vessel coronary disease with  progressive angina.   OPERATIVE PROCEDURES:  Median sternotomy, extracorporeal circulation,  coronary bypass graft surgery x4 using a left internal mammary graft to  left anterior descending coronary, with a sequential saphenous vein  graft to the first and second obtuse marginal branch of left circumflex  coronary, and a saphenous vein graft to posterior descending branch of  the right coronary artery.  Endoscopic vein harvesting from the left  leg.   ATTENDING SURGEON:  Evelene Croon, MD   ASSISTANT:  Salvatore Decent. Dorris Fetch, MD   SECOND ASSISTANT:  Coral Ceo, PA-C   ANESTHESIA:  General endotracheal.   CLINICAL HISTORY:  This patient is a 77 year old gentleman with a strong  family history of heart disease as well as a history of hypertension,  hyperlipidemia, and remote history of heavy smoking, who presented with  a 24-month history of progressively worsening chest pressure and  shortness of breath with exertion.  Given his risk factors, he underwent  cardiac catheterization.  This showed calcified 50% left main narrowing.  The LAD had ostial 95% stenosis.  There is a long proximal 40% stenosis.  There is a small second diagonal branch that had about 80% ostial  stenosis.  The left circumflex was diffusely irregular proximally.  There is about 50% midvessel stenosis.  There were 2 marginal branches.  There were moderate size vessels, the first having about 40-50% stenosis  and the second one having about long 60% stenosis.  The posterolateral  had no significant  disease in it.  The right coronary artery was  occluded at its ostium with faint filling of the posterior descending  branch by collaterals from the left.  Left ventricular ejection fraction  was 65% with no mitral regurgitation.  After review of the  catheterization and examination of the patient, it was felt that  coronary bypass graft surgery would be the best treatment to prevent  further ischemia infarction.  I discussed the operative procedure with  the patient including alternatives, benefits, and risks including but  not limited to bleeding, blood transfusion, infection, stroke,  myocardial infarction, graft failure, and death.  He understood and  agreed to proceed.   OPERATIVE PROCEDURE:  The patient was taken to the operating room and  placed on the table in supine position.  After induction of general  endotracheal anesthesia, a Foley catheter was placed in bladder using  sterile technique.  Then the chest, abdomen, and both lower extremities  were prepped and draped in usual sterile manner.  The chest was entered  through a median sternotomy incision of the pericardium in the midline.  Examination heart showed good ventricular  contractility.  The ascending  aorta had some palpable plaque anteriorly.   Then, the left internal mammary artery was harvest from the chest wall  as pedicle graft.  This was a medium caliber vessel with excellent blood  flow through it.  At the same time, a segment of greater saphenous vein  was harvested from the left leg using endoscopic vein harvest technique.  This vein was a medium size and good quality.  We initially examined the  vein adjacent to the right knee, but this vein was small and very  superficial and not felt to be ideal.  This vein was not harvested.   Then, the patient was heparinized and an adequate ACT was obtained.  The  distal ascending aorta was cannulated using a 20-French aortic cannula  for arterial inflow.  Venous  outflow was achieved using a 2-stage venous  cannula for the right atrial appendage.  An antegrade cardioplegia and  vent cannula was inserted in the aortic root.   The patient was placed on cardiopulmonary bypass and distal coronary was  identified.  The LAD was lying deep in the epicardial fat and was  located as its midportion where it was a moderate-sized graftable  vessel.  The diagonal branches were all small non-graftable vessels.  The left circumflex gave off moderate-sized first and second marginal  branches, which were both graftable.  The posterolateral branch was  slightly smaller, but would be graftable.  This appeared to communicate  well with the remainder of the left circumflex and therefore was not  grafted.  The right coronary gave off a single posterior descending  branch, which was located proximally where it was a moderate-sized  graftable vessel.  It quickly tapered down into a less than 1.5-mm  vessel distally.   Then, the aorta was crossclamped and 600 mL of cold blood antegrade  cardioplegia was administered in the aortic root with quick arrest of  the heart.  Systemic hypothermia to 28 degrees centigrade and topical  hypothermic iced saline was used.  A temperature probe placed in septum  and insulating pad in the pericardium.   The first distal anastomosis was performed to the posterior descending  coronary artery.  The internal diameter proximally was about 1.75 mm.  The conduit used was a segment of greater saphenous vein and the  anastomosis was performed in an end-to-side manner using continuous 7-0  Prolene suture.  Flow was noted through the graft and was excellent.   Second distal anastomosis was performed to the first obtuse marginal  branch.  The internal diameter was 1.75 mm.  The conduit used was a  second segment of greater saphenous vein and anastomosis was performed  in a sequential side-to-side manner using a continuous 7-0 Prolene  suture.   Flow was noted through the graft and was excellent.   The third distal anastomosis was performed to the second marginal  branch.  The internal diameter was also about 1.75 mm.  The conduit used  was the same segment of greater saphenous vein and the anastomosis was  performed in a sequential end-to-side manner using a continuous 7-0  Prolene suture.  Flow was noted through the graft and was excellent.  Then, another dose of cardioplegia was given down the vein graft in the  aortic root.   The fourth distal anastomosis was performed to the midportion of the  left anterior descending coronary artery.  The internal diameter of this  vessel was about 1.75 mm.  The  conduit used was the left internal  mammary graft and was brought through an opening of the left pericardium  in the anterior of the phrenic nerve.  This was anastomosed to the LAD  in an end-to-side manner using a continuous 8-0 Prolene suture.  The  pedicle was sutured to the epicardium with 6-0 Prolene sutures.  The  patient rewarmed at 37 degrees centigrade.  With crossclamp in place, 2  proximal vein graft anastomoses were performed in the aortic root in an  end-to-side manner using a continuous 6-0 Prolene suture.  Then, the  clamp was removed from the mammary pedicle.  There was rapid warming of  ventricular septum and return of spontaneous ventricular fibrillation.  The crossclamp was removed and time of 73 minutes.  The patient was  spontaneously converted to sinus rhythm.   The proximal and distal anastomoses appeared hemostatic while the grafts  satisfactory.  Graft markers were placed around the proximal  anastomoses.  Two temporary right ventricular and right atrial pacing  wires were placed above the skin.   The patient was rewarmed to 37 degrees centigrade.  He was weaned from  cardiopulmonary bypass on no inotropic agents.  Total bypass time was 86  minutes.  Cardiac function appeared excellent with a cardiac  output of 5  liters per minute.  Protamine was given and venous and aortic were  removed without difficulty.  Hemostasis was achieved.  Three chest tubes  were placed with tube into the posterior pericardium, one in the left  pleural space and one in the anterior mediastinum.  The pericardium was  loosely reapproximated over the heart.  The sternum was closed with #6  stainless steel wire.  Fascia was closed with continuous #1 Vicryl  suture.  Subcutaneous tissue was closed with continuous 2-0 Vicryl and  the skin with 3-0 Vicryl subcuticular closure.  The lower extremity vein  harvest sites were closed in layers in similar manner.  The sponge,  needles, and instrument counts were correct according to the scrub  nurse.  Dry sterile dressings were applied over the incisions around the  chest tubes, which were hooked up for Pleur-Evac suction.  The patient  remained hemodynamically stable and was transferred to the SICU guarded,  but stable condition.      Evelene Croon, M.D.  Electronically Signed     BB/MEDQ  D:  08/25/2007  T:  08/26/2007  Job:  [id number]

## 2010-08-20 NOTE — Assessment & Plan Note (Signed)
[facility] OFFICE NOTE   NAME:[name], Christian FERG                      MRN:          [id number]  DATE:11/19/2007                            DOB:          02-09-1932    PRIMARY CARE PHYSICIAN:  Kerby Nora, MD   INTERVAL HISTORY:  Christian [name] is a very pleasant 77 year old male with  history of coronary artery disease, status post bypass surgery earlier  this year.  He has a normal ejection fraction.  He was followed by Dr.  Antoine Poche, but is now transferring his care to me as Dr. Antoine Poche will no  longer be staffing the [facility] office with [place].   [name] says he is doing very well.  He has been very-very active in  the yard, cutting trees and doing all kinds of things around the house  without any chest pain or shortness of breath.  He has not had any lower  extremity edema.  He does note that the area around his ankle where one  his saphenous vein graft was taken remains numb at the skin but he is  not having any pain there at all.   CURRENT MEDICATIONS:  1. Lisinopril 40 a day.  2. Fenofibrate 160 a day.  3. Aspirin 325 a day.  4. Lopressor 25 b.i.d.  5. Crestor 10 a day.   PHYSICAL EXAMINATION:  GENERAL:  He is well-appearing, in no acute  distress.  He ambulates around the clinic without any respiratory  difficulty.  VITAL SIGNS:  Blood pressure is 144/64, heart rate 62, and weight is  145.  HEENT:  Normal.  NECK:  Supple.  There is no JVD.  Carotids are 2+ bilaterally.  There is  a bruit on the right side.  There is no lymphadenopathy or thyromegaly.  CARDIAC:  Regular rate and rhythm with no murmurs, rubs or gallops.  LUNGS:  Clear.  ABDOMEN:  Soft, nontender, nondistended.  No hepatosplenomegaly.  No  bruits, no masses.  Good bowel sounds.  EXTREMITIES:  Warm with no cyanosis, clubbing or edema.  On his left  hand, he is status post reattachment of his thumb.  NEURO:  Alert and oriented x3.   Cranial nerves II-XII are intact.  Moves  all 4 without difficulty.  Affect is pleasant.   ASSESSMENT AND PLAN:  1. Coronary artery disease.  He is doing quite well with no evidence      of recurrent ischemia.  Continue current therapy.  2. Hypertension.  Blood pressure is elevated.  We have switched his      metoprolol over to Coreg 6.25 b.i.d.  He will follow up with Dr.      Ermalene Searing.  3. Hyperlipidemia.  This is followed Dr. Ermalene Searing.  Goal LDL is less      than 70.  Titrate statin as needed.   DISPOSITION:  Overall, he is doing quite well.  I will see him back in 1  year for routine cardiac followup.   Of note, he does have a right carotid bruit.  I assume that this was  checked before his bypass surgery, but we will check his records to  confirm that he has had a carotid ultrasound.  He will need a followup  carotid ultrasound next year to track.     Bevelyn Buckles. Bensimhon, MD  Electronically Signed    DRB/MedQ  DD: 11/19/2007  DT: 11/20/2007  Job #: [id number]   cc:   Kerby Nora, MD

## 2010-08-20 NOTE — Discharge Summary (Signed)
NAME:  Christian [name], Christian [name] NO.:  [id number]   MEDICAL RECORD NO.:  [id number]          PATIENT TYPE:  INP   LOCATION:  2017                         FACILITY:  [facility]   PHYSICIAN:  Evelene Croon, M.D.     DATE OF BIRTH:  07/29/1931   DATE OF ADMISSION:  08/24/2007  DATE OF DISCHARGE:  08/29/2007                               DISCHARGE SUMMARY   PRIMARY ADMITTING DIAGNOSIS:  Chest pain.   ADDITIONAL/DISCHARGE DIAGNOSES:  1. Severe three-vessel coronary artery disease.  2. Unstable angina.  3. Hyperlipidemia.  4. Hypertension.  5. Remote history of heavy tobacco abuse.   PROCEDURES PERFORMED:  1. Cardiac catheterization.  2. Coronary artery bypass grafting x4 (left internal mammary artery to      the LAD, saphenous vein graft to the posterior descending,      sequential saphenous vein graft to the first and second obtuse      marginals).  3. Endoscopic vein harvest left leg.   HISTORY:  The patient is a 77 year old male, who presented with a 4-  month history of progressively worsening chest pressure and shortness of  breath with exertion.  He had had a previous stress test about 8 years  ago that was normal.  He was referred to Dr. Antoine Poche and underwent  cardiac catheterization on Aug 24, 2007.  He was found to have a 50%  calcified left main stenosis, and ostial 95% stenosis of the LAD, and  80% second diagonal, diffuse disease of the left circumflex, a 60%  moderate-sized second marginal, a 40% moderate-sized first marginal.  The right coronary artery was occluded proximally and was a dominant  vessel.  The ejection fraction was about 65%.  Based on these findings  and his worsening symptoms, he was admitted and a cardiac surgery  consultation was obtained.   HOSPITAL COURSE:  Christian [name] was admitted following his catheterization.  He was seen by Dr. Evelene Croon for consideration of surgical  revascularization.  Dr. Laneta Simmers reviewed his films and agreed that  his  best course of action at this point would be to proceed with CABG.  He  explained the risks, benefits, and alternatives of the procedure to the  patient and he agreed to proceed with surgery.  He remained stable and  pain-free in the hospital prior to surgery.  He was taken to the  operating room on Aug 25, 2007, and underwent CABG x4 as described in  detail above.  He tolerated procedure well and was transferred to the  SICU in stable condition.  He was able to be extubated shortly after  surgery.  He was hemodynamically stable and doing well on postop day #1.  At that time, his chest tubes and hemodynamic monitoring devices were  removed and he was able to be transferred to the step-down unit.  Postoperatively, he has done very well.  He was started back on his home  dose of beta blocker and ACE inhibitor and his doses have been titrated  upward accordingly.  He has been diuresed back down to his preoperative  weight and currently on  physical exam has minimal lower extremity edema.  He has had a mild renal insufficiency postoperatively with creatinine of  1.4, but this has been stable throughout his admission.  He has been  afebrile and his vital signs have been stable.  He is currently off  supplemental oxygen remaining maintaining O2 sats of greater than 90% on  room air.  His incisions are all healing well.  He is tolerating a  regular diet and is ambulating in the halls without difficulty.  His  most recent labs show a hemoglobin of 10.6, hematocrit of 31.1,  platelets of 105, white count 11.1, sodium 139, potassium 3.6 which has  been replaced, BUN 24, creatinine 1.44.  He has been seen and evaluated  on morning rounds on Aug 29, 2007, and it is felt that at this time he  is ready for discharge home.   Discharge medications were as follows;  1. Tylox 1-2 q.4 h. p.r.n. for pain.  2. Aspirin 325 mg daily.  3. Crestor 10 mg daily.  4. Lopressor 25 mg b.i.d.  5. Lisinopril 40  mg daily.  6. Tricor 160 mg daily.   DISCHARGE INSTRUCTIONS:  He is asked to refrain from driving, heavy  lifting, or strenuous activity.  He may continue ambulating daily and  using his Incentive spirometer.  He may shower daily and clean his  incisions with soap and water.  He will continue a low-fat, low-sodium  diet.   DISCHARGE FOLLOW-UP:  He will see Dr. Antoine Poche back in the office in 2  weeks and will call to schedule this appointment.  He will also follow  up with Dr. Laneta Simmers in 3 weeks with a chest x-ray from [facility]  imaging.  He may call in the interim if he experiences problems or have  questions.      Coral Ceo, P.A.      Evelene Croon, M.D.  Electronically Signed    GC/MEDQ  D:  08/29/2007  T:  08/29/2007  Job:  [id number]   cc:   Rollene Rotunda, MD, [facility]  Kerby Nora, MD

## 2010-08-20 NOTE — Telephone Encounter (Signed)
Pt is having trouble with his gallbladder and he went to the emergency room at [facility] and saw Dr. Katrinka Blazing. Pt states that Dr. Katrinka Blazing is ready to take his gallbladder out in the next couple weeks. Dr. Katrinka Blazing wanted to know when patient can come off his Plavix and aspirin. Please advise. Thanks, Huntley Dec.

## 2010-08-20 NOTE — Assessment & Plan Note (Signed)
OFFICE VISIT   EPHRAIM, REICHEL  DOB:  Apr 14, 1931                                        September 27, 2007  CHART #:  [id number]   Mr. Phegley comes in today for a wound check.  He is status post CABG x4  on Aug 25, 2007.  He saw Dr. Laneta Simmers 2 weeks ago and was released from  our care.  However, over the last week, his right lower extremity EVH  site has developed some erythema, is exclusively tender, and has started  to drain some purulent material.  He has not had a fever.   PHYSICAL EXAMINATION:  VITAL SIGNS:  Blood pressure 122/69, pulse is 64,  respirations 18, and O2 sat 99%.  EXTREMITIES:  Exam of the right lower extremity shows that his EVH site  has very superficially separated.  There was one small punctate area of  purulent drainage.  When this is probed with a Q-tip no further pus is  expressed.  However, some old dark blood is drained from the area.  The  surrounding area is mildly erythematous and tender to touch.  There does  not appear to be a tract, which can be packed.  According to the  operative note, he did not actually undergo vein harvest from the right  lower extremity.  This side was merely explored and the vein was not  felt to be usable.   IMPRESSION:  Mr. Kittleson appears to have a small area of old hematoma,  which has probably gotten infected.  I was only able to express a small  amount of pus, but mostly just old blood.  The area is only  superficially separated, therefore, I have placed a dry dressing over it  and I started him on Keflex 500 mg t.i.d. x1 week.   PLAN:  We will make him a followup appointment to return in 1 week for a  wound recheck.  He is asked to call in the interim if he experiences any  further problems.   Evelene Croon, M.D.  Electronically Signed   GC/MEDQ  D:  09/27/2007  T:  09/27/2007  Job:  [id number]

## 2010-08-20 NOTE — Telephone Encounter (Signed)
Patient advised.

## 2010-08-20 NOTE — Assessment & Plan Note (Signed)
OFFICE VISIT   Christian [name], Christian [name]  DOB:  October 18, 1931                                        September 14, 2007  CHART #:  [id number]   Christian [name] returned today for followup status post coronary artery bypass  graft surgery x4 on Aug 25, 2007.  He is not feeling well overall and is  walking at least 1 mile per day without any chest pain or shortness of  breath.  He said he feels that he can walk a lot more.  His only  complaint has been of some pain and numbness in the left lower leg  extending down onto the dorsum of his medial foot and mild ankle  swelling on that side.  He said that they did have some x-rays in Dr.  Jenene Slicker office last week, which only showed heel spurs.  These are  chronic.  He said this pain is completely different from the pain that  he has had from heel spurs in the past.   PHYSICAL EXAMINATION:  On physical examination today, his blood pressure  is 161/71 and his pulse is 55 and regular.  Respiratory rate is 18,  unlabored.  Oxygen saturation on room air is 99%.  He looks well.  Cardiac:  Shows a regular rate and rhythm with normal heart sounds.  His  lung exam is clear.  The chest incision is healing well and the sternum  is stable.  His left leg incisions are healing well.  There is minimal  edema in the left ankle and foot.   Followup chest x-ray today shows clear lung fields and no pleural  effusions.   MEDICATIONS:  Lisinopril 40 mg daily, Lopressor 25 mg b.i.d., Tricor 160  mg daily, Crestor 10 mg daily, and aspirin 325 mg daily.   IMPRESSION:  Overall, Christian [name] is recovering well following his  surgery.  I believe that his pain and numbness in the left lower leg and  foot are related to the saphenous nerve, which gets irritated with  saphenous vein harvesting.  His symptoms are classic for this and should  gradually improve over the next 3-6 months.  I encouraged him to  continue walking as much as possible.  I told him he  could return to  driving a car, but should refrain from lifting anything heavier than 10  pounds for total of 3 months from the date of surgery.  His blood  pressure is moderately elevated today, but he said he does check his  blood pressure at home and it has been under good control.  He said that  when he had it checked in Dr. Jenene Slicker office recently, it was 130/60.  This will require continued followup.  He will continue to follow up  with Dr. Antoine Poche and will contact me if he develops any problems with  his incisions.   Evelene Croon, M.D.  Electronically Signed   BB/MEDQ  D:  09/14/2007  T:  09/15/2007  Job:  [id number]   cc:   Rollene Rotunda, MD, [facility]  Kerby Nora, MD

## 2010-08-21 NOTE — Telephone Encounter (Signed)
Had DES in 2/12. Can't come off Plavix for 1 year after that.

## 2010-08-21 NOTE — Telephone Encounter (Signed)
Pt.notified

## 2010-08-22 ENCOUNTER — Ambulatory Visit: Payer: MEDICARE | Admitting: Family Medicine

## 2010-08-22 DIAGNOSIS — Z0289 Encounter for other administrative examinations: Secondary | ICD-10-CM

## 2010-08-23 NOTE — Assessment & Plan Note (Signed)
[facility]                             [facility] OFFICE NOTE   NAME:Christian [name], Christian [name]                      MRN:          [id number]  DATE:12/17/2005                            DOB:          1931-07-17    CHIEF COMPLAINT:  [age]-year-old white male here to establish with a  new doctor.   HISTORY OF PRESENT ILLNESS:  Christian [name] states he has been seeing Dr.  Lin Givens for the past several years.  He states that more recently he has  not been getting along with her.  He states that he has not had a problem  with her, but he feels that she had a problem with him.  He states that she  asked him to find another doctor because they did not get along as well as  the fact that he was missing appointments.  He denies missing appointments.   Christian [name] states that he has been feeling very well and is very active.  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  His  major health problem that he has been trying to manage over the past few  years has been hypertension.  He states that this has been poorly controlled  for a while.  He has tried three medications in the past but he is unsure of  their name but had chest heaviness, shortness of breath, and bad feeling  along with all of these medications.  He is now on Lisinopril 20 mg daily  and states he is feeling well with his medication but his blood pressure has  not decreased.  He states that for the chest pain and shortness of breath,  he did go to the emergency room once and he had a negative work-up for heart  attack.   REVIEW OF SYSTEMS:  Wears glasses.  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  He wears upper and lower dentures.  He denies shortness of breath,  chest pain, palpitations, nausea, vomiting, diarrhea, constipation, rectal  bleeding.  No incontinence.  No arthralgias.  No myalgias.   PAST MEDICAL HISTORY:  1. Hypertension.  2. Hypercholesterolemia.  3. COPD, although the patient states that he was told he had this but      never had any shortness of breath.  4. 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   HOSPITALIZATIONS/SURGERIES/PROCEDURES:  1. PFTs, unknown results.  2. Thumb surgery at [facility] following saw injury where he sawed off his      thumb, 2003.  3. Appendectomy.  4. Prostate surgery for nodules, benign.  5. Normal treadmill stress test, 2005.  6. Hospitalization for weakness, dizziness, 2004.  7. Carotid Dopplers negative, 2007.  8. Colonoscopy, unsure what year.   ALLERGIES:  No known drug allergies.   MEDICATIONS:  1. Lisinopril 20 mg daily.  2. Lovastatin 20 mg daily.  3. Aspirin 325 mg daily.   FAMILY HISTORY:  Father deceased at age 51 with coronary artery disease and  emphysema.  Mother deceased at age 2 with coronary artery disease.  His  father did also have an MI before age 36 at around age 64.  He has 12  siblings, all who have a strong history of coronary artery disease as well  as some who have emphysema.  There is no family history of any type of  cancer, depression, or alcohol or drug abuse.   SOCIAL HISTORY:  He is originally from [place] but has been in the  area for about 15 years.  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  He has been  widowed for nine years.  He has five children who are all healthy.  He  currently lives alone and is able to do his activities of daily living.  As  far as exercise. He tries to get aerobic exercise every other day.  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  He rarely has  alcohol, about two beers per year.  He is a former smoker but quit nine  years ago and smoked for an unknown number of years.   PHYSICAL EXAMINATION:  VITAL SIGNS:  Height 64 inches.  Weight 146.  BMI  between 24 and 26.  Pulse 56.  Temperature 97.7.  GENERAL:  Health-appearing  elderly male, appears younger than stated age.  In no apparent distress.  HEENT:  Arcus senilis bilaterally.  PERRLA.  Extraocular muscles intact.  Watery eyes bilaterally.  Nares clear.  Tympanic membranes bilateral  perforation.  Oropharynx clear.  No thyromegaly.  NECK:  No lymphadenopathy, supraclavicular or cervical.  PULMONARY:  Lungs clear to auscultation bilaterally.  No wheezes, rales, or  rhonchi.  No increased work of breathing.  CARDIOVASCULAR:  Regular rate and rhythm.  No murmurs, rubs, or gallops.  Normal PMI.  EXTREMITIES:  Peripheral pulses 2+.  No peripheral edema.  ABDOMEN:  Soft, nontender.  Normoactive bowel sound.  No hepatosplenomegaly.  MUSCULOSKELETAL:  Strength 5/5 in upper and lower extremities.  NEURO:  Cranial nerves II through XII grossly intact.  Alert and oriented  x3.  Reflexes 2+.  SKIN:  No worrisome lesions.   ASSESSMENT AND PLAN:  1. Hypertension, poor control:  At this point in time I will increase his      Lisinopril to 40 mg daily.  He will let me know if he has any side      effects from the medication.  I will also obtain his previous records      to determine what past preventative medicine has been done.  We do need      to optimize his risk factors such as cholesterol.  I encouraged him to      continue to exercise.  2. Prevention: In addition to finding out when a cholesterol panel was      done, I will also need to find out when his colonoscopy was done last      which may have been over 10 years ago.  We can also discuss doing a PSA      test if he would like to given the fact that he is active and      would consider prostate surgery if need be.  We can discuss these      issues following the receipt of his records.  Kerby Nora, MD   AB/MedQ  DD:  12/17/2005  DT:  12/17/2005  Job #:  [id number]

## 2010-10-03 ENCOUNTER — Other Ambulatory Visit: Payer: Self-pay | Admitting: *Deleted

## 2010-10-03 MED ORDER — HYDROCODONE-ACETAMINOPHEN 5-500 MG PO TABS: 1.0000 | ORAL_TABLET | Freq: Four times a day (QID) | ORAL | Status: AC | PRN

## 2010-10-03 NOTE — Telephone Encounter (Signed)
rx called to pharmacy 

## 2010-10-03 NOTE — Telephone Encounter (Signed)
Pt is asking for vicodin for a gall bladder attack.  He was in a lot of pain last night and was unable to sleep.  He says you are aware of his gallbladder issues.  He said he has been taking vicodin but I dont see it on his med list.  Uses [facility] [address]

## 2010-11-06 ENCOUNTER — Other Ambulatory Visit: Payer: Self-pay | Admitting: Cardiology

## 2010-11-06 ENCOUNTER — Encounter: Payer: Self-pay | Admitting: Internal Medicine

## 2010-11-06 DIAGNOSIS — I6529 Occlusion and stenosis of unspecified carotid artery: Secondary | ICD-10-CM

## 2010-11-08 ENCOUNTER — Encounter (INDEPENDENT_AMBULATORY_CARE_PROVIDER_SITE_OTHER): Payer: MEDICARE | Admitting: *Deleted

## 2010-11-08 DIAGNOSIS — I6529 Occlusion and stenosis of unspecified carotid artery: Secondary | ICD-10-CM

## 2010-11-11 ENCOUNTER — Encounter: Payer: Self-pay | Admitting: Family Medicine

## 2010-11-20 ENCOUNTER — Encounter: Payer: Self-pay | Admitting: Internal Medicine

## 2010-11-20 ENCOUNTER — Ambulatory Visit (INDEPENDENT_AMBULATORY_CARE_PROVIDER_SITE_OTHER): Payer: MEDICARE | Admitting: Internal Medicine

## 2010-11-20 VITALS — BP 138/62 | HR 55 | Ht 63.0 in | Wt 142.0 lb

## 2010-11-20 DIAGNOSIS — I2510 Atherosclerotic heart disease of native coronary artery without angina pectoris: Secondary | ICD-10-CM

## 2010-11-20 DIAGNOSIS — I6529 Occlusion and stenosis of unspecified carotid artery: Secondary | ICD-10-CM

## 2010-11-20 NOTE — Assessment & Plan Note (Addendum)
Excellent functional capacity. No evidence of ischemia. Given DES ideally should have Plavix for 1 year unless emergent indication. As GB now quiet with dietary modification would continue Plavix for full year before elective surgery.

## 2010-11-20 NOTE — Progress Notes (Signed)
HPI:  [name] is a very pleasant 77 year old male with history of coronary artery disease, status post bypass surgery in 2009.  He has a normal ejection fraction.  PMH also notable for HTN, HL, carotid stenosis and COPD. Returns for routine f/u.  Cartoid u/s 1/11 R 60-79% L 40-59% stable. F/u carotids 8/12 40-59% bilaterally  Admitted to [facility] in February 2012 with [place]. Underwen cath which Showed LAD T LCX OM-2 long 80% RCA totalled proximally. SVG-PDA was patent. LIMA patent. SVG-OM chronically occluded. Underwent PCI with Xience DES to OM-2.   Doing very well from a cardiac standpoint. [place] [employer] [place] and [place] vigorously. Push mowing up and down hills. No CP or SOB. Over the summer has had 3 episodes of ab pain and found to have gallstones. Was trying to get his gallbladder out but surgeon said he would have to wait until Feb 2013 due to Plavix. He then stopped Plavix for 12 days and went back to surgeon who told him to get back on Plavix. Now watching his diet very closely and has not had further GB attacks.    ROS: All systems negative except as listed in HPI, PMH and Problem List.  Past Medical History  Diagnosis Date  . COPD (chronic obstructive pulmonary disease)   . Hyperlipidemia   . Hypertension   . Carotid stenosis     Current Outpatient Prescriptions  Medication Sig Dispense Refill  . aspirin 81 MG tablet Take 81 mg by mouth daily.        Marland Kitchen atorvastatin (LIPITOR) 40 MG tablet       . carvedilol (COREG) 6.25 MG tablet 1 tab po bid  60 tablet  5  . clopidogrel (PLAVIX) 75 MG tablet Take 1 tablet (75 mg total) by mouth daily.  30 tablet  6  . lisinopril (PRINIVIL,ZESTRIL) 40 MG tablet Take 40 mg by mouth daily.           PHYSICAL EXAM: Filed Vitals:   11/20/10 1547  BP: 138/62  Pulse: 55   General:  well appearing. no resp difficulty HEENT: normal Neck.no JVD. Carotids 2+ bilat; R side bruit. No lymphadenopathy or thryomegaly appreciatted. Cor: PMI nondisplaced.  Regular rate & rhythm. No rubs, murmur. +s4 Lungs: clear with decreased air movement Abdomen: soft, nontender, nondistended. Good bowel sounds. Extremities: no cyanosis, clubbing, rash, edema.  Neuro: alert & orientedx3, cranial nerves grossly intact. moves all 4 extremities w/o difficulty. affect pleasant    ECG: Sinus brady 55. RSR' No ST-T wave abnormalities.     ASSESSMENT & PLAN:

## 2010-11-20 NOTE — Patient Instructions (Signed)
Your physician recommends that you schedule a follow-up appointment in: 6 months  

## 2010-11-20 NOTE — Assessment & Plan Note (Signed)
Reviewed u/s with him today. This is stable and asymptomatic. Continue yearly f/u.

## 2010-12-05 ENCOUNTER — Other Ambulatory Visit: Payer: Self-pay | Admitting: *Deleted

## 2010-12-05 MED ORDER — ATORVASTATIN CALCIUM 40 MG PO TABS: 40.0000 mg | ORAL_TABLET | Freq: Every day | ORAL | Status: DC

## 2010-12-05 MED ORDER — CLOPIDOGREL BISULFATE 75 MG PO TABS: 75.0000 mg | ORAL_TABLET | Freq: Every day | ORAL | Status: DC

## 2010-12-05 MED ORDER — CARVEDILOL 6.25 MG PO TABS: ORAL_TABLET | ORAL | Status: DC

## 2010-12-05 MED ORDER — LISINOPRIL 40 MG PO TABS: 40.0000 mg | ORAL_TABLET | Freq: Every day | ORAL | Status: DC

## 2010-12-08 ENCOUNTER — Other Ambulatory Visit: Payer: Self-pay | Admitting: Family Medicine

## 2010-12-10 ENCOUNTER — Ambulatory Visit (INDEPENDENT_AMBULATORY_CARE_PROVIDER_SITE_OTHER): Payer: MEDICARE | Admitting: Family Medicine

## 2010-12-10 ENCOUNTER — Encounter: Payer: Self-pay | Admitting: Family Medicine

## 2010-12-10 VITALS — BP 130/70 | HR 58 | Temp 97.9°F | Ht 63.5 in | Wt 143.8 lb

## 2010-12-10 DIAGNOSIS — M542 Cervicalgia: Secondary | ICD-10-CM

## 2010-12-10 DIAGNOSIS — G8929 Other chronic pain: Secondary | ICD-10-CM

## 2010-12-10 NOTE — Progress Notes (Signed)
  Subjective:    Patient ID: Christian [name], male    DOB: [birth year], 77 y.o.   MRN: [id number]  HPI  77 year old male presents with worsening of chronic neck pain, worse in last few months. He has restricted movement in neck, occ wakes him up at night. Ache in central and lateral neck, occ pain runs up neck to lower skull. Using heat in shower, occ tylenol, helps minimally.  In past prescribed flexeril , diclofenac and meloxicam helped minimally.  Heat and home PT minimally effective.   No pain radiating down arms, no numbness or weakness in arms.  He has been exercising at [employer], walking and doing weights.   X ray in neck 01/2010: 1. Severe chronic cervical disc degeneration C4-C5 through C6-C7.  2. Multifactorial bilateral lower cervical neural foraminal stenosis.     Review of Systems  Constitutional: Negative for fever and fatigue.  HENT: Negative for ear pain and sore throat.   Eyes: Negative for pain.  Respiratory: Negative for cough and shortness of breath.   Cardiovascular: Negative for chest pain, palpitations and leg swelling.       Objective:   Physical Exam  Constitutional: He appears well-developed and well-nourished.  HENT:  Head: Normocephalic and atraumatic.  Right Ear: External ear normal.  Left Ear: External ear normal.  Nose: Nose normal.  Mouth/Throat: Oropharynx is clear and moist.  Eyes: Conjunctivae and EOM are normal. Pupils are equal, round, and reactive to light.  Neck: No thyromegaly present.       Decreased ROM in neck, ttp left SCM, no vertebral ttp, neg Spurling's  Neurological: He has normal strength and normal reflexes. He is not disoriented. He displays no atrophy and no tremor. No cranial nerve deficit or sensory deficit. He exhibits normal muscle tone. He displays a negative Romberg sign. He displays no seizure activity. Coordination and gait normal.          Assessment & Plan:

## 2010-12-10 NOTE — Patient Instructions (Signed)
Stop by front desk to speak with Shirlee Limerick.

## 2010-12-11 ENCOUNTER — Telehealth: Payer: Self-pay | Admitting: Family Medicine

## 2010-12-11 NOTE — Telephone Encounter (Signed)
Patient advised as instructed via telephone.  He stated that he will just take Tylenol.

## 2010-12-11 NOTE — Telephone Encounter (Signed)
Called patient tp schedule the Physical Medicine appt. He says he has changed his mind about getting the injection and no longer wants this scheduled. I told him I would relay this to you.

## 2010-12-11 NOTE — Telephone Encounter (Signed)
Okay... Would he liketrial of other pain medication instead?

## 2010-12-11 NOTE — Telephone Encounter (Signed)
Noted  

## 2010-12-27 ENCOUNTER — Encounter: Payer: Self-pay | Admitting: Family Medicine

## 2010-12-27 NOTE — Assessment & Plan Note (Signed)
Not a candidate for strong NSAIDs, muscle relaxant not helping. Home PT not helping, refuses PT outpt.  Refer to [facility] for possible facet joint injections.

## 2011-01-01 LAB — BASIC METABOLIC PANEL
BUN: 15
BUN: 21
CO2: 26
CO2: 30
Calcium: 8.6
Chloride: 108
Chloride: 109
Creatinine, Ser: 1.4
Creatinine, Ser: 1.42
Creatinine, Ser: 1.48
GFR calc Af Amer: 58 — ABNORMAL LOW
GFR calc Af Amer: 59 — ABNORMAL LOW
GFR calc non Af Amer: 46 — ABNORMAL LOW
GFR calc non Af Amer: 49 — ABNORMAL LOW
Potassium: 4.2
Sodium: 139

## 2011-01-01 LAB — TYPE AND SCREEN
ABO/RH(D): O POS
Antibody Screen: NEGATIVE

## 2011-01-01 LAB — BLOOD GAS, ARTERIAL
Acid-base deficit: 1.2
Bicarbonate: 22.9
FIO2: 0.21
O2 Saturation: 96.7
Patient temperature: 98.6
TCO2: 24.1
pCO2 arterial: 38.4
pH, Arterial: 7.394
pO2, Arterial: 84.8

## 2011-01-01 LAB — POCT I-STAT 4, (NA,K, GLUC, HGB,HCT)
Glucose, Bld: 101 — ABNORMAL HIGH
Glucose, Bld: 80
Glucose, Bld: 87
Glucose, Bld: 88
HCT: 28 — ABNORMAL LOW
HCT: 32 — ABNORMAL LOW
HCT: 32 — ABNORMAL LOW
Hemoglobin: 10.9 — ABNORMAL LOW
Hemoglobin: 10.9 — ABNORMAL LOW
Hemoglobin: 6.8 — CL
Hemoglobin: 9.5 — ABNORMAL LOW
Operator id: 3402
Operator id: 3402
Potassium: 4.3
Potassium: 5
Potassium: 5.3 — ABNORMAL HIGH
Sodium: 139
Sodium: 140

## 2011-01-01 LAB — CBC
HCT: 32.9 — ABNORMAL LOW
HCT: 36.6 — ABNORMAL LOW
Hemoglobin: 12.4 — ABNORMAL LOW
MCHC: 33.9
MCHC: 33.9
MCV: 94.1
MCV: 94.3
MCV: 94.4
MCV: 96.3
Platelets: 105 — ABNORMAL LOW
Platelets: 105 — ABNORMAL LOW
Platelets: 119 — ABNORMAL LOW
Platelets: 133 — ABNORMAL LOW
Platelets: 206
RBC: 3.8 — ABNORMAL LOW
RDW: 13.1
RDW: 14
RDW: 14
WBC: 11.1 — ABNORMAL HIGH
WBC: 7.3
WBC: 7.7

## 2011-01-01 LAB — POCT I-STAT 3, ART BLOOD GAS (G3+)
Acid-base deficit: 2
Acid-base deficit: 2
Bicarbonate: 23.4
O2 Saturation: 94
Operator id: 137421
Operator id: 3402
Patient temperature: 35.1
TCO2: 24
TCO2: 25
pH, Arterial: 7.425
pO2, Arterial: 332 — ABNORMAL HIGH
pO2, Arterial: 81

## 2011-01-01 LAB — COMPREHENSIVE METABOLIC PANEL
ALT: 16
AST: 19
Albumin: 3.1 — ABNORMAL LOW
CO2: 25
Chloride: 108
GFR calc Af Amer: >60
GFR calc non Af Amer: 59 — ABNORMAL LOW
Potassium: 4.4
Sodium: 139
Total Bilirubin: 0.5

## 2011-01-01 LAB — POCT I-STAT, CHEM 8
BUN: 14
Calcium, Ion: 1.21
Chloride: 108
Glucose, Bld: 121 — ABNORMAL HIGH
HCT: 30 — ABNORMAL LOW

## 2011-01-01 LAB — LIPID PANEL
HDL: 38 — ABNORMAL LOW
LDL Cholesterol: 95
Total CHOL/HDL Ratio: 3.8
Triglycerides: 67
VLDL: 13

## 2011-01-01 LAB — URINALYSIS, ROUTINE W REFLEX MICROSCOPIC
Bilirubin Urine: NEGATIVE
Glucose, UA: NEGATIVE
Hgb urine dipstick: NEGATIVE
Ketones, ur: NEGATIVE
Nitrite: NEGATIVE
Protein, ur: NEGATIVE
Specific Gravity, Urine: 1.024
Urobilinogen, UA: 1
pH: 6.5

## 2011-01-01 LAB — URINE MICROSCOPIC-ADD ON

## 2011-01-01 LAB — CREATININE, SERUM: GFR calc Af Amer: >60

## 2011-01-01 LAB — HEMOGLOBIN AND HEMATOCRIT, BLOOD
HCT: 30.9 — ABNORMAL LOW
Hemoglobin: 10.9 — ABNORMAL LOW

## 2011-01-01 LAB — MAGNESIUM
Magnesium: 2.7 — ABNORMAL HIGH
Magnesium: 2.9 — ABNORMAL HIGH

## 2011-01-01 LAB — HEMOGLOBIN A1C: Hgb A1c MFr Bld: 5.5

## 2011-02-08 ENCOUNTER — Observation Stay: Payer: Self-pay | Admitting: Surgery

## 2011-02-09 DIAGNOSIS — Z01810 Encounter for preprocedural cardiovascular examination: Secondary | ICD-10-CM

## 2011-03-09 ENCOUNTER — Encounter (HOSPITAL_COMMUNITY): Payer: Self-pay | Admitting: *Deleted

## 2011-03-09 ENCOUNTER — Emergency Department (HOSPITAL_COMMUNITY): Payer: Medicare Other

## 2011-03-09 ENCOUNTER — Inpatient Hospital Stay (HOSPITAL_COMMUNITY)
Admission: EM | Admit: 2011-03-09 | Discharge: 2011-03-11 | DRG: 378 | Disposition: A | Discharge status: Home / Self Care | Payer: Medicare Other | Attending: Internal Medicine | Admitting: Internal Medicine

## 2011-03-09 DIAGNOSIS — K921 Melena: Secondary | ICD-10-CM | POA: Diagnosis present

## 2011-03-09 DIAGNOSIS — K254 Chronic or unspecified gastric ulcer with hemorrhage: Principal | ICD-10-CM | POA: Diagnosis present

## 2011-03-09 DIAGNOSIS — Z951 Presence of aortocoronary bypass graft: Secondary | ICD-10-CM

## 2011-03-09 DIAGNOSIS — K920 Hematemesis: Secondary | ICD-10-CM

## 2011-03-09 DIAGNOSIS — E785 Hyperlipidemia, unspecified: Secondary | ICD-10-CM | POA: Diagnosis present

## 2011-03-09 DIAGNOSIS — J449 Chronic obstructive pulmonary disease, unspecified: Secondary | ICD-10-CM | POA: Diagnosis present

## 2011-03-09 DIAGNOSIS — Z955 Presence of coronary angioplasty implant and graft: Secondary | ICD-10-CM

## 2011-03-09 DIAGNOSIS — D62 Acute posthemorrhagic anemia: Secondary | ICD-10-CM | POA: Diagnosis present

## 2011-03-09 DIAGNOSIS — J4489 Other specified chronic obstructive pulmonary disease: Secondary | ICD-10-CM | POA: Diagnosis present

## 2011-03-09 DIAGNOSIS — I2510 Atherosclerotic heart disease of native coronary artery without angina pectoris: Secondary | ICD-10-CM | POA: Diagnosis present

## 2011-03-09 DIAGNOSIS — R51 Headache: Secondary | ICD-10-CM | POA: Diagnosis present

## 2011-03-09 DIAGNOSIS — R519 Headache, unspecified: Secondary | ICD-10-CM | POA: Diagnosis present

## 2011-03-09 DIAGNOSIS — Z8719 Personal history of other diseases of the digestive system: Secondary | ICD-10-CM | POA: Diagnosis present

## 2011-03-09 DIAGNOSIS — I10 Essential (primary) hypertension: Secondary | ICD-10-CM | POA: Diagnosis present

## 2011-03-09 DIAGNOSIS — Z7982 Long term (current) use of aspirin: Secondary | ICD-10-CM

## 2011-03-09 LAB — URINALYSIS, ROUTINE W REFLEX MICROSCOPIC
Bilirubin Urine: NEGATIVE
Glucose, UA: NEGATIVE mg/dL
Hgb urine dipstick: NEGATIVE
Ketones, ur: NEGATIVE mg/dL
Nitrite: NEGATIVE
Protein, ur: NEGATIVE mg/dL
Specific Gravity, Urine: 1.023 (ref 1.005–1.030)
Urobilinogen, UA: 0.2 mg/dL (ref 0.0–1.0)
pH: 5 (ref 5.0–8.0)

## 2011-03-09 LAB — COMPREHENSIVE METABOLIC PANEL
ALT: 14 U/L (ref 0–53)
AST: 15 U/L (ref 0–37)
Albumin: 2.9 g/dL — ABNORMAL LOW (ref 3.5–5.2)
Alkaline Phosphatase: 85 U/L (ref 39–117)
BUN: 33 mg/dL — ABNORMAL HIGH (ref 6–23)
CO2: 19 mEq/L (ref 19–32)
Calcium: 8.7 mg/dL (ref 8.4–10.5)
Chloride: 108 mEq/L (ref 96–112)
Creatinine, Ser: 1.02 mg/dL (ref 0.50–1.35)
GFR calc Af Amer: 80 mL/min — ABNORMAL LOW (ref >90)
GFR calc non Af Amer: 69 mL/min — ABNORMAL LOW (ref >90)
Glucose, Bld: 144 mg/dL — ABNORMAL HIGH (ref 70–99)
Potassium: 4.1 mEq/L (ref 3.5–5.1)
Sodium: 137 mEq/L (ref 135–145)
Total Bilirubin: 0.3 mg/dL (ref 0.3–1.2)
Total Protein: 6.1 g/dL (ref 6.0–8.3)

## 2011-03-09 LAB — SAMPLE TO BLOOD BANK

## 2011-03-09 LAB — CBC
Hemoglobin: 10.1 g/dL — ABNORMAL LOW (ref 13.0–17.0)
MCH: 31.8 pg (ref 26.0–34.0)
RBC: 3.18 MIL/uL — ABNORMAL LOW (ref 4.22–5.81)
WBC: 13.4 10*3/uL — ABNORMAL HIGH (ref 4.0–10.5)

## 2011-03-09 LAB — LIPASE, BLOOD: Lipase: 48 U/L (ref 11–59)

## 2011-03-09 LAB — DIFFERENTIAL
Basophils Absolute: 0.1 10*3/uL (ref 0.0–0.1)
Basophils Relative: 1 % (ref 0–1)
Eosinophils Absolute: 0.5 10*3/uL (ref 0.0–0.7)
Eosinophils Relative: 4 % (ref 0–5)
Lymphocytes Relative: 27 % (ref 12–46)
Lymphs Abs: 3.6 10*3/uL (ref 0.7–4.0)
Monocytes Absolute: 1 10*3/uL (ref 0.1–1.0)
Monocytes Relative: 7 % (ref 3–12)
Neutro Abs: 8.2 10*3/uL — ABNORMAL HIGH (ref 1.7–7.7)
Neutrophils Relative %: 61 % (ref 43–77)

## 2011-03-09 LAB — HEMOGLOBIN AND HEMATOCRIT, BLOOD
HCT: 23.9 % — ABNORMAL LOW (ref 39.0–52.0)
Hemoglobin: 8.1 g/dL — ABNORMAL LOW (ref 13.0–17.0)
Hemoglobin: 8.3 g/dL — ABNORMAL LOW (ref 13.0–17.0)

## 2011-03-09 LAB — PROTIME-INR: INR: 1.08 (ref 0.00–1.49)

## 2011-03-09 LAB — OCCULT BLOOD, POC DEVICE: Fecal Occult Bld: NEGATIVE

## 2011-03-09 MED ORDER — SODIUM CHLORIDE 0.9 % IV SOLN
Freq: Once | INTRAVENOUS | Status: AC
  Administered 2011-03-09: 12:00:00 via INTRAVENOUS

## 2011-03-09 MED ORDER — ONDANSETRON HCL 4 MG/2ML IJ SOLN: 4.0000 mg | Freq: Four times a day (QID) | INTRAMUSCULAR | Status: DC | PRN

## 2011-03-09 MED ORDER — PANTOPRAZOLE SODIUM 40 MG IV SOLR
40.0000 mg | Freq: Two times a day (BID) | INTRAVENOUS | Status: DC
  Administered 2011-03-09 – 2011-03-11 (×4): 40 mg via INTRAVENOUS
  Filled 2011-03-09 (×5): qty 40

## 2011-03-09 MED ORDER — SODIUM CHLORIDE 0.9 % IV SOLN
INTRAVENOUS | Status: DC
  Administered 2011-03-09 – 2011-03-10 (×2): via INTRAVENOUS

## 2011-03-09 MED ORDER — PANTOPRAZOLE SODIUM 40 MG IV SOLR
40.0000 mg | Freq: Once | INTRAVENOUS | Status: AC
  Administered 2011-03-09: 40 mg via INTRAVENOUS
  Filled 2011-03-09: qty 40

## 2011-03-09 MED ORDER — MORPHINE SULFATE 2 MG/ML IJ SOLN: 1.0000 mg | INTRAMUSCULAR | Status: DC | PRN

## 2011-03-09 MED ORDER — PANTOPRAZOLE SODIUM 40 MG IV SOLR: 40.0000 mg | Freq: Two times a day (BID) | INTRAVENOUS | Status: DC

## 2011-03-09 MED ORDER — MORPHINE SULFATE 4 MG/ML IJ SOLN
4.0000 mg | Freq: Once | INTRAMUSCULAR | Status: AC
  Administered 2011-03-09: 4 mg via INTRAVENOUS
  Filled 2011-03-09: qty 1

## 2011-03-09 MED ORDER — ONDANSETRON HCL 4 MG/2ML IJ SOLN
4.0000 mg | Freq: Once | INTRAMUSCULAR | Status: AC
  Administered 2011-03-09: 4 mg via INTRAVENOUS
  Filled 2011-03-09: qty 2

## 2011-03-09 NOTE — ED Notes (Signed)
Vomiting at triage. NSR on ekg.

## 2011-03-09 NOTE — ED Notes (Signed)
Dr. Davidson at bedside.

## 2011-03-09 NOTE — ED Provider Notes (Signed)
History     CSN: [id number] Arrival date & time: 03/09/2011 11:04 AM   First MD Initiated Contact with Patient 03/09/11 1128      Chief Complaint  Patient presents with  . Shortness of Breath    (Consider location/radiation/quality/duration/timing/severity/associated sxs/prior treatment) HPI Comments: Patient is a 77 year old man, who has felt dizzy for the past few days, and today in the stretcher triage vomited up blood. He thinks he may have had black bowel movements. There is no history of ulcer disease. He does take aspirin and Plavix. He had recent gallbladder surgery 2 or 3 months ago.  Patient is a 77 y.o. male presenting with GI illlness.  GI Problem  This is a new problem. The current episode started less than 1 hour ago. The problem has not changed since onset.The stool consistency is described as bloody. There has been no fever. Associated symptoms include vomiting. He has tried nothing for the symptoms. Past medical history comments: He is on Plavix and aspirin for coronary artery disease.Marland Kitchen    Past Medical History  Diagnosis Date  . COPD (chronic obstructive pulmonary disease)   . Hyperlipidemia   . Hypertension   . Carotid stenosis     Past Surgical History  Procedure Date  . Appendectomy   . Prostate surgery     nodules benign  . Kidney lithotripsy   . Thumb surgery   . Cholecystectomy   . Coronary artery bypass graft     Family History  Problem Relation Age of Onset  . Heart disease Mother     CAD  . Heart disease Father   . Emphysema Father     History  Substance Use Topics  . Smoking status: Former Smoker    Types: Cigarettes    Quit date: 06/10/2001  . Smokeless tobacco: Not on file  . Alcohol Use: Yes      Review of Systems  Constitutional: Negative.   HENT: Negative.   Eyes: Negative.   Cardiovascular: Negative.   Gastrointestinal: Positive for vomiting.  Genitourinary: Negative.   Musculoskeletal: Negative.   Skin: Negative.     Neurological: Negative.   Psychiatric/Behavioral: Negative.     Allergies  Review of patient's allergies indicates no known allergies.  Home Medications   Current Outpatient Rx  Name Route Sig Dispense Refill  . ASPIRIN 81 MG PO TABS Oral Take 81 mg by mouth daily.     . ATORVASTATIN CALCIUM 40 MG PO TABS Oral Take 40 mg by mouth daily.      Marland Kitchen CARVEDILOL 6.25 MG PO TABS Oral Take 6.25 mg by mouth 2 (two) times daily.     Marland Kitchen CLOPIDOGREL BISULFATE 75 MG PO TABS Oral Take 75 mg by mouth daily.      Marland Kitchen LISINOPRIL 40 MG PO TABS Oral Take 40 mg by mouth daily.        BP 105/55  Pulse 83  Resp 21  SpO2 98%  Physical Exam  Constitutional: He appears well-developed and well-nourished. No distress.  HENT:  Head: Normocephalic and atraumatic.  Right Ear: External ear normal.  Left Ear: External ear normal.  Mouth/Throat: Oropharynx is clear and moist.  Eyes: Conjunctivae and EOM are normal. Pupils are equal, round, and reactive to light. No scleral icterus.  Neck: Normal range of motion. Neck supple.  Cardiovascular: Normal rate, regular rhythm and normal heart sounds.   Pulmonary/Chest: Effort normal and breath sounds normal.  Abdominal: Soft. Bowel sounds are normal.  Genitourinary:  Rectal exam shows no mass or tenderness. The stool is light brown in color. Hemoccult card was submitted for testing.  Musculoskeletal: Normal range of motion. He exhibits no edema.  Skin: Skin is warm and dry.  Psychiatric: He has a normal mood and affect. His behavior is normal.    ED Course  Procedures (including critical care time)  3:04 PM  Date: 03/09/2011  Rate: 93  Rhythm: normal sinus rhythm  QRS Axis: normal  Intervals: normal  ST/T Wave abnormalities: normal  Conduction Disutrbances:none  Narrative Interpretation: Normal EKG  Old EKG Reviewed: changes noted--Inferior Q waves noted on 08/26/2007 have normalized.  3:04 PM Pt. was seen and had physical examination. Laboratory  testing was ordered. Stool for Hemoccult was ordered. IV fluids were ordered. IV proton except as ordered.  3:04 PM Results for orders placed during the hospital encounter of 03/09/11  CBC      Component Value Range   WBC 13.4 (*) 4.0 - 10.5 (K/uL)   RBC 3.18 (*) 4.22 - 5.81 (MIL/uL)   Hemoglobin 10.1 (*) 13.0 - 17.0 (g/dL)   HCT 16.1 (*) 09.6 - 52.0 (%)   MCV 94.3  78.0 - 100.0 (fL)   MCH 31.8  26.0 - 34.0 (pg)   MCHC 33.7  30.0 - 36.0 (g/dL)   RDW 04.5  40.9 - 81.1 (%)   Platelets 241  150 - 400 (K/uL)  DIFFERENTIAL      Component Value Range   Neutrophils Relative 61  43 - 77 (%)   Neutro Abs 8.2 (*) 1.7 - 7.7 (K/uL)   Lymphocytes Relative 27  12 - 46 (%)   Lymphs Abs 3.6  0.7 - 4.0 (K/uL)   Monocytes Relative 7  3 - 12 (%)   Monocytes Absolute 1.0  0.1 - 1.0 (K/uL)   Eosinophils Relative 4  0 - 5 (%)   Eosinophils Absolute 0.5  0.0 - 0.7 (K/uL)   Basophils Relative 1  0 - 1 (%)   Basophils Absolute 0.1  0.0 - 0.1 (K/uL)  COMPREHENSIVE METABOLIC PANEL      Component Value Range   Sodium 137  135 - 145 (mEq/L)   Potassium 4.1  3.5 - 5.1 (mEq/L)   Chloride 108  96 - 112 (mEq/L)   CO2 19  19 - 32 (mEq/L)   Glucose, Bld 144 (*) 70 - 99 (mg/dL)   BUN 33 (*) 6 - 23 (mg/dL)   Creatinine, Ser 9.14  0.50 - 1.35 (mg/dL)   Calcium 8.7  8.4 - 78.2 (mg/dL)   Total Protein 6.1  6.0 - 8.3 (g/dL)   Albumin 2.9 (*) 3.5 - 5.2 (g/dL)   AST 15  0 - 37 (U/L)   ALT 14  0 - 53 (U/L)   Alkaline Phosphatase 85  39 - 117 (U/L)   Total Bilirubin 0.3  0.3 - 1.2 (mg/dL)   GFR calc non Af Amer 69 (*) >90 (mL/min)   GFR calc Af Amer 80 (*) >90 (mL/min)  URINALYSIS, ROUTINE W REFLEX MICROSCOPIC      Component Value Range   Color, Urine YELLOW  YELLOW    APPearance CLEAR  CLEAR    Specific Gravity, Urine 1.023  1.005 - 1.030    pH 5.0  5.0 - 8.0    Glucose, UA NEGATIVE  NEGATIVE (mg/dL)   Hgb urine dipstick NEGATIVE  NEGATIVE    Bilirubin Urine NEGATIVE  NEGATIVE    Ketones, ur NEGATIVE   NEGATIVE (mg/dL)  Protein, ur NEGATIVE  NEGATIVE (mg/dL)   Urobilinogen, UA 0.2  0.0 - 1.0 (mg/dL)   Nitrite NEGATIVE  NEGATIVE    Leukocytes, UA MODERATE (*) NEGATIVE   LIPASE, BLOOD      Component Value Range   Lipase 48  11 - 59 (U/L)  PROTIME-INR      Component Value Range   Prothrombin Time 14.2  11.6 - 15.2 (seconds)   INR 1.08  0.00 - 1.49   APTT      Component Value Range   aPTT 26  24 - 37 (seconds)  SAMPLE TO BLOOD BANK      Component Value Range   Blood Bank Specimen SAMPLE AVAILABLE FOR TESTING     Sample Expiration 03/10/2011    OCCULT BLOOD, POC DEVICE      Component Value Range   Fecal Occult Bld NEGATIVE    URINE MICROSCOPIC-ADD ON      Component Value Range   WBC, UA 7-10  <3 (WBC/hpf)   Bacteria, UA RARE  RARE    Lab tests show anemia.  Stool was negative for blood. Will need admission for further evaluation of his hematemesis.  3:04 PM Prolonged wait for callback.  3:04 PM Case discussed with Dr. Eda Paschal.  Admit to [facility] team 5, to Dr. Dyann Ruddle.     1. Hematemesis           Carleene Cooper III, MD 03/09/11 1504

## 2011-03-09 NOTE — Consult Note (Signed)
Referring Provider: Dr. Ignacia Palma Primary Care Physician:  Kerby Nora, MD, MD Primary Gastroenterologist:  Gentry Fitz  Reason for Consultation:  Hematemesis  HPI: Christian [name] is a 77 y.o. male on plavix and aspirin being seen as a consult due to episode of hematemesis witnessed in ER that was approximately 50cc-100cc. Prior to arrival to ER he was having weakness and frontal headaches for several days. He reports having a black stool X 1 last Thursday. No further black stools. Denies N/V prior to coming to ER. Denies hematochezia. Denies abdominal pain, heartburn, dysphagia, or odynophagia. On Plavix and aspirin. Denies other NSAIDs. Remote history of colon polyps stating procedure was done 40 years ago and he never had another one. Never had an EGD.  Past Medical History  Diagnosis Date  . COPD (chronic obstructive pulmonary disease)   . Hyperlipidemia   . Hypertension   . Carotid stenosis     Past Surgical History  Procedure Date  . Appendectomy   . Prostate surgery     nodules benign  . Kidney lithotripsy   . Thumb surgery   . Cholecystectomy   . Coronary artery bypass graft     Prior to Admission medications   Medication Sig Start Date End Date Taking? Authorizing Provider  aspirin 81 MG tablet Take 81 mg by mouth daily.    Yes Historical Provider, MD  atorvastatin (LIPITOR) 40 MG tablet Take 40 mg by mouth daily.   12/05/10  Yes Amy Bedsole, MD  carvedilol (COREG) 6.25 MG tablet Take 6.25 mg by mouth 2 (two) times daily.  12/05/10  Yes Amy Ermalene Searing, MD  clopidogrel (PLAVIX) 75 MG tablet Take 75 mg by mouth daily.   12/05/10  Yes Amy Bedsole, MD  lisinopril (PRINIVIL,ZESTRIL) 40 MG tablet Take 40 mg by mouth daily.     Yes Historical Provider, MD    Current Facility-Administered Medications  Medication Dose Route Frequency Provider Last Rate Last Dose  . 0.9 %  sodium chloride infusion   Intravenous Once Carleene Cooper III, MD 250 mL/hr at 03/09/11 1228    . morphine 2  MG/ML injection 1 mg  1 mg Intravenous Q4H PRN Hind I. Elsaid      . morphine 4 MG/ML injection 4 mg  4 mg Intravenous Once Carleene Cooper III, MD   4 mg at 03/09/11 1454  . ondansetron (ZOFRAN) injection 4 mg  4 mg Intravenous Once Carleene Cooper III, MD   4 mg at 03/09/11 1454  . pantoprazole (PROTONIX) injection 40 mg  40 mg Intravenous Once Carleene Cooper III, MD   40 mg at 03/09/11 1227    Allergies as of 03/09/2011  . (No Known Allergies)    Family History  Problem Relation Age of Onset  . Heart disease Mother     CAD  . Heart disease Father   . Emphysema Father     History   Social History  . Marital Status: Widowed    Spouse Name: N/A    Number of Children: 5  . Years of Education: N/A   Occupational History  . ODD JOBS    Social History Main Topics  . Smoking status: Former Smoker    Types: Cigarettes    Quit date: 06/10/2001  . Smokeless tobacco: Not on file  . Alcohol Use: Yes  . Drug Use: No  . Sexually Active: Not on file   Other Topics Concern  . Not on file   Social History Narrative  . No narrative on  file    Review of Systems: All negative except as stated above in HPI.  Physical Exam: Vital signs: Filed Vitals:   03/09/11 1821  BP: 115/85  Pulse:   Temp: 98.9 F (37.2 C)  Resp:      General:   Elderly, Alert,  Well-developed, well-nourished, pleasant and cooperative in NAD HEENT: anicteric, no oropharynx clear Neck: soft, nontender, no LAD Lungs:  Clear throughout to auscultation.   No wheezes, crackles, or rhonchi. No acute distress. Heart:  Regular rate and rhythm; no murmurs, clicks, rubs,  or gallops. Abdomen: soft, NT, ND, +BS  Rectal:  Deferred  GI:  Lab Results:  Basename 03/09/11 1743 03/09/11 1200  WBC -- 13.4*  HGB 8.1* 10.1*  HCT 23.9* 30.0*  PLT -- 241   BMET  Basename 03/09/11 1200  NA 137  K 4.1  CL 108  CO2 19  GLUCOSE 144*  BUN 33*  CREATININE 1.02  CALCIUM 8.7   LFT  Basename 03/09/11 1200  PROT  6.1  ALBUMIN 2.9*  AST 15  ALT 14  ALKPHOS 85  BILITOT 0.3  BILIDIR --  IBILI --   PT/INR  Basename 03/09/11 1200  LABPROT 14.2  INR 1.08     Studies/Results: Dg Abd Acute W/chest  03/09/2011  *RADIOLOGY REPORT*  Clinical Data: Vomiting, weakness.  ACUTE ABDOMEN SERIES (ABDOMEN 2 VIEW & CHEST 1 VIEW)  Comparison: None.  Findings: Prior CABG.  Heart is normal size.  Scarring in the left lung base.  Right lung is clear.  No effusions.  Moderate stool burden throughout the colon.  No obstruction or free air.  Prior cholecystectomy.  No organomegaly or suspicious calcification. Degenerative changes in the lumbar spine.  IMPRESSION: Moderate stool burden throughout the colon.  No obstruction or free air.  Left basilar scarring.  Original Report Authenticated By: Cyndie Chime, M.D.    Impression: 77yo with episode of hematemesis (today in ER) in setting of plavix and aspirin. Also one episode of black stools last week. Hemodynamically stable. Question peptic ulcer vs. Esophagitis. Doubt AVM or Dieulafoy.  Plan: EGD on 03/10/11. IV PPI Q 12 hours. NPO after midnight. Follow H/Hs.   LOS: 0 days   Vipul Cafarelli C.  03/09/2011, 7:01 PM

## 2011-03-09 NOTE — Plan of Care (Signed)
Problem: Consults Goal: Skin Care Protocol Initiated - if indicated If consults are not indicated, leave blank or document N/A Outcome: Not Applicable Date Met:  03/09/11 Skin intact.

## 2011-03-09 NOTE — ED Notes (Signed)
Reports being sob x several days, having pain to neck and feels like it is "choking him." airway intact at triage, spo2 100%. ekg being done, reprots feeling dizzy.

## 2011-03-09 NOTE — ED Notes (Signed)
Pt aware of need for urine sample - unable to urinate at this time.  Urinal placed at bedside.

## 2011-03-09 NOTE — H&P (Signed)
Christian [name] is an 77 y.o. male.   Chief Complaint:  Dizziness, right side headache for 5 days. One episode of blood vomiting in the emergency room HPI: 77 year old male with a history of coronary artery disease A. status post CABG,also on February 2012Underwen cath which Showed LAD T LCX OM-2 long 80% RCA totalled proximally. SVG-PDA was patent. LIMA patent. SVG-OM chronically occluded. Underwent PCI with Xience DES to OM-2, currently on aspirin and Plavix since February 6 of 2012, recently has a cholecystectomy done at [facility], the patient for the last 5 days he complained of dizziness whenever he stands or moves his head to certain position. Condition associated with moderate  right-sided headache, denies any using of NSAID. As secondary to see views of dizziness and headache he presented to the ED, and while there he experienced episodes of nausea and he vomited a significant amount of blood, patient also noticed a dark stool for couple of days. He denies any abdomen pain he also felt some choking episode when eats or cough y. He still with some episode of dizziness especially when he moves his head also complained of blurring of vision .   and Past Medical History  Diagnosis Date  . COPD (chronic obstructive pulmonary disease)   . Hyperlipidemia   . Hypertension   . Carotid stenosis Coronary artery disease status post CABG and recent stent on February of this year  Chronic neck pain      Past Surgical History  Procedure Date  . Appendectomy   . Prostate surgery     nodules benign  . Kidney lithotripsy   . Thumb surgery   . Cholecystectomy   . Coronary artery bypass graft     Family History  Problem Relation Age of Onset  . Heart disease Mother     CAD  . Heart disease Father   . Emphysema Father    Social History: he quit smoking 9 years ago, denies any illicit drug abuse and denies an alcohol abuse he lives with his son  Allergies: No Known  Allergies  Medications Prior to Admission  Medication Dose Route Frequency Provider Last Rate Last Dose  . 0.9 %  sodium chloride infusion   Intravenous Once Carleene Cooper III, MD 250 mL/hr at 03/09/11 1228    . morphine 4 MG/ML injection 4 mg  4 mg Intravenous Once Carleene Cooper III, MD   4 mg at 03/09/11 1454  . ondansetron (ZOFRAN) injection 4 mg  4 mg Intravenous Once Carleene Cooper III, MD   4 mg at 03/09/11 1454  . pantoprazole (PROTONIX) injection 40 mg  40 mg Intravenous Once Carleene Cooper III, MD   40 mg at 03/09/11 1227   Medications Prior to Admission  Medication Sig Dispense Refill  . aspirin 81 MG tablet Take 81 mg by mouth daily.       Marland Kitchen atorvastatin (LIPITOR) 40 MG tablet Take 40 mg by mouth daily.        . carvedilol (COREG) 6.25 MG tablet Take 6.25 mg by mouth 2 (two) times daily.       . clopidogrel (PLAVIX) 75 MG tablet Take 75 mg by mouth daily.          Results for orders placed during the hospital encounter of 03/09/11 (from the past 48 hour(s))  SAMPLE TO BLOOD BANK     Status: Normal   Collection Time   03/09/11 11:25 AM      Component Value Range Comment  Blood Bank Specimen SAMPLE AVAILABLE FOR TESTING      Sample Expiration 03/10/2011     CBC     Status: Abnormal   Collection Time   03/09/11 12:00 PM      Component Value Range Comment   WBC 13.4 (*) 4.0 - 10.5 (K/uL)    RBC 3.18 (*) 4.22 - 5.81 (MIL/uL)    Hemoglobin 10.1 (*) 13.0 - 17.0 (g/dL)    HCT 16.1 (*) 09.6 - 52.0 (%)    MCV 94.3  78.0 - 100.0 (fL)    MCH 31.8  26.0 - 34.0 (pg)    MCHC 33.7  30.0 - 36.0 (g/dL)    RDW 04.5  40.9 - 81.1 (%)    Platelets 241  150 - 400 (K/uL)   DIFFERENTIAL     Status: Abnormal   Collection Time   03/09/11 12:00 PM      Component Value Range Comment   Neutrophils Relative 61  43 - 77 (%)    Neutro Abs 8.2 (*) 1.7 - 7.7 (K/uL)    Lymphocytes Relative 27  12 - 46 (%)    Lymphs Abs 3.6  0.7 - 4.0 (K/uL)    Monocytes Relative 7  3 - 12 (%)    Monocytes Absolute  1.0  0.1 - 1.0 (K/uL)    Eosinophils Relative 4  0 - 5 (%)    Eosinophils Absolute 0.5  0.0 - 0.7 (K/uL)    Basophils Relative 1  0 - 1 (%)    Basophils Absolute 0.1  0.0 - 0.1 (K/uL)   COMPREHENSIVE METABOLIC PANEL     Status: Abnormal   Collection Time   03/09/11 12:00 PM      Component Value Range Comment   Sodium 137  135 - 145 (mEq/L)    Potassium 4.1  3.5 - 5.1 (mEq/L)    Chloride 108  96 - 112 (mEq/L)    CO2 19  19 - 32 (mEq/L)    Glucose, Bld 144 (*) 70 - 99 (mg/dL)    BUN 33 (*) 6 - 23 (mg/dL)    Creatinine, Ser 9.14  0.50 - 1.35 (mg/dL)    Calcium 8.7  8.4 - 10.5 (mg/dL)    Total Protein 6.1  6.0 - 8.3 (g/dL)    Albumin 2.9 (*) 3.5 - 5.2 (g/dL)    AST 15  0 - 37 (U/L)    ALT 14  0 - 53 (U/L)    Alkaline Phosphatase 85  39 - 117 (U/L)    Total Bilirubin 0.3  0.3 - 1.2 (mg/dL)    GFR calc non Af Amer 69 (*) >90 (mL/min)    GFR calc Af Amer 80 (*) >90 (mL/min)   LIPASE, BLOOD     Status: Normal   Collection Time   03/09/11 12:00 PM      Component Value Range Comment   Lipase 48  11 - 59 (U/L)   PROTIME-INR     Status: Normal   Collection Time   03/09/11 12:00 PM      Component Value Range Comment   Prothrombin Time 14.2  11.6 - 15.2 (seconds)    INR 1.08  0.00 - 1.49    APTT     Status: Normal   Collection Time   03/09/11 12:00 PM      Component Value Range Comment   aPTT 26  24 - 37 (seconds)   OCCULT BLOOD, POC DEVICE     Status: Normal  Collection Time   03/09/11 12:13 PM      Component Value Range Comment   Fecal Occult Bld NEGATIVE     URINALYSIS, ROUTINE W REFLEX MICROSCOPIC     Status: Abnormal   Collection Time   03/09/11  1:36 PM      Component Value Range Comment   Color, Urine YELLOW  YELLOW     APPearance CLEAR  CLEAR     Specific Gravity, Urine 1.023  1.005 - 1.030     pH 5.0  5.0 - 8.0     Glucose, UA NEGATIVE  NEGATIVE (mg/dL)    Hgb urine dipstick NEGATIVE  NEGATIVE     Bilirubin Urine NEGATIVE  NEGATIVE     Ketones, ur NEGATIVE  NEGATIVE  (mg/dL)    Protein, ur NEGATIVE  NEGATIVE (mg/dL)    Urobilinogen, UA 0.2  0.0 - 1.0 (mg/dL)    Nitrite NEGATIVE  NEGATIVE     Leukocytes, UA MODERATE (*) NEGATIVE    URINE MICROSCOPIC-ADD ON     Status: Normal   Collection Time   03/09/11  1:36 PM      Component Value Range Comment   WBC, UA 7-10  <3 (WBC/hpf)    Bacteria, UA RARE  RARE     Dg Abd Acute W/chest  03/09/2011  *RADIOLOGY REPORT*  Clinical Data: Vomiting, weakness.  ACUTE ABDOMEN SERIES (ABDOMEN 2 VIEW & CHEST 1 VIEW)  Comparison: None.  Findings: Prior CABG.  Heart is normal size.  Scarring in the left lung base.  Right lung is clear.  No effusions.  Moderate stool burden throughout the colon.  No obstruction or free air.  Prior cholecystectomy.  No organomegaly or suspicious calcification. Degenerative changes in the lumbar spine.  IMPRESSION: Moderate stool burden throughout the colon.  No obstruction or free air.  Left basilar scarring.  Original Report Authenticated By: Cyndie Chime, M.D.  Blood pressure 112/46, pulse 96, resp. rate 24, SpO2 99.00%. On examination  Lying comfortably on bed , appropriate for his age , not on respiratory distress or shortness of breath  Heart S1 and S2 no added sounds  Lungs examination normal breathing no rales or rhonchi  Abdomen soft nontender bowel sounds positive  t extremity without lower edema peripheral pulses intact  CNS: Patient awake fully oriented , cranial nerves intact , no neck stiffness, and no peripheral weakness  Review of Systems  Constitutional: Positive for malaise/fatigue and diaphoresis. Negative for fever, chills and weight loss.  HENT: Positive for neck pain. Negative for hearing loss, ear pain, nosebleeds, tinnitus and ear discharge.        Has right side headache for almost one week, associated with dizzness,  Also has chronic neck pain when he turned his head to both side, he feels dizzy when looking up, also he felt some blurring of vision  Eyes: Positive for  blurred vision and double vision. Negative for pain, discharge and redness.  Respiratory: Positive for sputum production. Negative for cough, shortness of breath and wheezing.   Cardiovascular: Negative for chest pain, palpitations, orthopnea, claudication, leg swelling and PND.  Gastrointestinal: Positive for nausea, vomiting, blood in stool and melena. Negative for abdominal pain and diarrhea.       One episode of hematemesis  Genitourinary: Negative for dysuria, urgency, frequency, hematuria and flank pain.  Musculoskeletal: Negative for back pain, joint pain and falls.       N  Skin: Negative for itching and rash.  Neurological: Positive for dizziness,  tingling, weakness and headaches. Negative for tremors, sensory change, speech change (ck pain chronic and seen by his MD), focal weakness, seizures and loss of consciousness.  Endo/Heme/Allergies: Negative for environmental allergies and polydipsia. Does not bruise/bleed easily.    Blood pressure 112/46, pulse 96, resp. rate 24, SpO2 99.00%. Physical Exam   Assessment/Plan 1-upper GI bleeding: Likely from aspirin, Plavix Will hold , IV fluid , PPI IV , gastroenetrology consulted Dr Bosie Clos, serial H&H  2-headache and dizziness likely from #1 but the patient could have a history of chronic headache as previously 2008 he had a CT scan of his head  Related to the same pain, currently a will treat supportively if the patient continued to have headache consider further evaluation 3 3-coronary artery disease status post CABG and stent would hold heart medications including Coreg, this in a belt aspirin and Plavix. Blood pressure fluctuating and at the lower side the patient currently denies any chest pain or shortness of breath Laycie Schriner I. 03/09/2011, 5:44 PM

## 2011-03-10 ENCOUNTER — Encounter (HOSPITAL_COMMUNITY): Payer: Self-pay | Admitting: Gastroenterology

## 2011-03-10 ENCOUNTER — Encounter (HOSPITAL_COMMUNITY)
Admission: EM | Disposition: A | Discharge status: Home / Self Care | Payer: Self-pay | Source: Home / Self Care | Attending: Internal Medicine

## 2011-03-10 DIAGNOSIS — D62 Acute posthemorrhagic anemia: Secondary | ICD-10-CM | POA: Diagnosis present

## 2011-03-10 DIAGNOSIS — J449 Chronic obstructive pulmonary disease, unspecified: Secondary | ICD-10-CM | POA: Diagnosis present

## 2011-03-10 DIAGNOSIS — K921 Melena: Secondary | ICD-10-CM | POA: Diagnosis present

## 2011-03-10 DIAGNOSIS — R42 Dizziness and giddiness: Secondary | ICD-10-CM

## 2011-03-10 DIAGNOSIS — K922 Gastrointestinal hemorrhage, unspecified: Secondary | ICD-10-CM

## 2011-03-10 DIAGNOSIS — Z955 Presence of coronary angioplasty implant and graft: Secondary | ICD-10-CM

## 2011-03-10 HISTORY — PX: ESOPHAGOGASTRODUODENOSCOPY: SHX5428

## 2011-03-10 LAB — COMPREHENSIVE METABOLIC PANEL
ALT: 11 U/L (ref 0–53)
Albumin: 2.7 g/dL — ABNORMAL LOW (ref 3.5–5.2)
Alkaline Phosphatase: 59 U/L (ref 39–117)
BUN: 32 mg/dL — ABNORMAL HIGH (ref 6–23)
Potassium: 4.4 mEq/L (ref 3.5–5.1)
Sodium: 139 mEq/L (ref 135–145)
Total Protein: 5.3 g/dL — ABNORMAL LOW (ref 6.0–8.3)

## 2011-03-10 LAB — CBC
HCT: 22.5 % — ABNORMAL LOW (ref 39.0–52.0)
Hemoglobin: 7.5 g/dL — ABNORMAL LOW (ref 13.0–17.0)
MCV: 93.4 fL (ref 78.0–100.0)
RDW: 13.4 % (ref 11.5–15.5)
WBC: 5.8 10*3/uL (ref 4.0–10.5)

## 2011-03-10 SURGERY — EGD (ESOPHAGOGASTRODUODENOSCOPY)
Anesthesia: Moderate Sedation

## 2011-03-10 MED ORDER — ASPIRIN 81 MG PO TBEC: 81.0000 mg | DELAYED_RELEASE_TABLET | Freq: Every day | ORAL | Status: DC

## 2011-03-10 MED ORDER — BUTAMBEN-TETRACAINE-BENZOCAINE 2-2-14 % EX AERO
INHALATION_SPRAY | CUTANEOUS | Status: DC | PRN
  Administered 2011-03-10: 2 via TOPICAL

## 2011-03-10 MED ORDER — MIDAZOLAM HCL 10 MG/2ML IJ SOLN
INTRAMUSCULAR | Status: AC
  Filled 2011-03-10: qty 2

## 2011-03-10 MED ORDER — SODIUM CHLORIDE 0.9 % IV BOLUS (SEPSIS): 500.0000 mL | Freq: Once | INTRAVENOUS | Status: DC

## 2011-03-10 MED ORDER — FENTANYL NICU IV SYRINGE 50 MCG/ML
INJECTION | INTRAMUSCULAR | Status: DC | PRN
  Administered 2011-03-10 (×2): 25 ug via INTRAVENOUS

## 2011-03-10 MED ORDER — CLOPIDOGREL BISULFATE 75 MG PO TABS
75.0000 mg | ORAL_TABLET | Freq: Every day | ORAL | Status: DC
  Administered 2011-03-11: 75 mg via ORAL
  Filled 2011-03-10 (×2): qty 1

## 2011-03-10 MED ORDER — CARVEDILOL 3.125 MG PO TABS
3.1250 mg | ORAL_TABLET | Freq: Two times a day (BID) | ORAL | Status: DC
  Administered 2011-03-10 – 2011-03-11 (×2): 3.125 mg via ORAL
  Filled 2011-03-10 (×4): qty 1

## 2011-03-10 MED ORDER — ROSUVASTATIN CALCIUM 20 MG PO TABS
20.0000 mg | ORAL_TABLET | Freq: Every day | ORAL | Status: DC
  Administered 2011-03-10: 20 mg via ORAL
  Filled 2011-03-10 (×2): qty 1

## 2011-03-10 MED ORDER — FENTANYL CITRATE 0.05 MG/ML IJ SOLN
INTRAMUSCULAR | Status: AC
  Filled 2011-03-10: qty 2

## 2011-03-10 MED ORDER — MIDAZOLAM HCL 10 MG/2ML IJ SOLN
INTRAMUSCULAR | Status: DC | PRN
  Administered 2011-03-10 (×2): 2 mg via INTRAVENOUS

## 2011-03-10 MED ORDER — ASPIRIN EC 81 MG PO TBEC
81.0000 mg | DELAYED_RELEASE_TABLET | Freq: Every day | ORAL | Status: DC
  Administered 2011-03-10 – 2011-03-11 (×2): 81 mg via ORAL
  Filled 2011-03-10 (×2): qty 1

## 2011-03-10 NOTE — Brief Op Note (Signed)
03/09/2011 - 03/10/2011  2:09 PM  PATIENT:  Christian [name]  77 y.o. male  PRE-OPERATIVE DIAGNOSIS:  gi bleed  POST-OPERATIVE DIAGNOSIS:  ulcer cardia stomach  PROCEDURE:  Procedure(s): ESOPHAGOGASTRODUODENOSCOPY (EGD)  SURGEON:  Surgeon(s): Barrie Folk, MD  Please see formal procedure note. One Sheria Lang erosions seen in the hiatal hernia sac possibly responsible for previous GI blood loss

## 2011-03-10 NOTE — Progress Notes (Signed)
Subjective: Awake and endorses feels much better than yesterday. Headache has resolved and no further melena. Denies chest pain or SOB. Endorses no prior history of GERD/indigestion or GIB.  Objective: Vital signs in last 24 hours: Temp:  [96.7 F (35.9 C)-98.9 F (37.2 C)] 96.7 F (35.9 C) (12/03 1314) Pulse Rate:  [55-96] 58  (12/03 1100) Resp:  [10-24] 16  (12/03 1444) BP: (70-146)/(29-85) 108/45 mmHg (12/03 1444) SpO2:  [94 %-100 %] 96 % (12/03 1444) Weight:  [63.3 kg (139 lb 8.8 oz)-64.864 kg (143 lb)] 143 lb (64.864 kg) (12/03 1314) Weight change:  Last BM Date: 03/09/11  Intake/Output from previous day: 12/02 0701 - 12/03 0700 In: 2120 [P.O.:360; I.V.:1750; IV Piggyback:10] Out: 675 [Urine:675] Intake/Output this shift: Total I/O In: 400 [I.V.:400] Out: 800 [Urine:800]  General appearance: alert, cooperative, appears stated age and no distress Resp: clear to auscultation bilaterally Cardio: regular rate and rhythm, S1, S2 normal, no murmur, click, rub or gallop GI: soft, non-tender; bowel sounds normal; no masses,  no organomegaly Extremities: extremities normal, atraumatic, no cyanosis or edema Neurologic: Grossly normal  Lab Results:  Basename 03/10/11 0500 03/09/11 2027 03/09/11 1200  WBC 5.8 -- 13.4*  HGB 7.5* 8.3* --  HCT 22.5* 24.4* --  PLT 157 -- 241   BMET  Basename 03/10/11 0500 03/09/11 1200  NA 139 137  K 4.4 4.1  CL 108 108  CO2 25 19  GLUCOSE 89 144*  BUN 32* 33*  CREATININE 1.10 1.02  CALCIUM 8.3* 8.7    Studies/Results: Dg Abd Acute W/chest  03/09/2011  *RADIOLOGY REPORT*  Clinical Data: Vomiting, weakness.  ACUTE ABDOMEN SERIES (ABDOMEN 2 VIEW & CHEST 1 VIEW)  Comparison: None.  Findings: Prior CABG.  Heart is normal size.  Scarring in the left lung base.  Right lung is clear.  No effusions.  Moderate stool burden throughout the colon.  No obstruction or free air.  Prior cholecystectomy.  No organomegaly or suspicious calcification.  Degenerative changes in the lumbar spine.  IMPRESSION: Moderate stool burden throughout the colon.  No obstruction or free air.  Left basilar scarring.  Original Report Authenticated By: Cyndie Chime, M.D.    Medications: I have reviewed the patient's current medications.  Assessment/Plan:  Principal Problem:  *GI bleeding/ Melena Appreciate GI assistance.Underwent EGD today which revealed a Sheria Lang erosion in the hiatal hernia sac that may be responsible for his blood loss. Plan to continue PPI and follow up on H.pylori status. Cardiology is to comment on continuation of the patient's Plavix therapy. Given that the patient's drug-eluting stent is less than a-year-old it would likely be ideal if we could continue his Plavix. We will need to ask GI to weigh in on this as well.  Symptomatic Anemia associated with acute blood loss Evidenced by headache and dizziness which has resolved.  Will transfuse 2 units PRBC's today. Follow serial CBCs.   S/P CABG (coronary artery bypass graft)/ S/P coronary artery stent placement with DES  Had stent placed at [facility] February 2012 and was on ASA/Plavix pre admit. Will ask Cardiology to see patient and comment on duration of holding either drug or continuing both despite EGD findings  We may need to continue and monitor for new bleeding.  Please see discussion above.   HYPERTENSION BP soft due to ABL anemia- home Coreg and Lisinopril on hold. Will resume both as soon as possible.   COPD (chronic obstructive pulmonary disease) Compensated.  Disposition Remain in SDU   LOS:  1 day   Junious Silk, ANP pager [phone] 03/10/2011, 3:37 PM  I have personally examined this patient and reviewed the entire database. I have reviewed the above note, made any necessary editorial changes, and agree with its content.  Lonia Blood, MD [facility]

## 2011-03-10 NOTE — Progress Notes (Signed)
[facility] Progress Note  Subjective: No complaints. No stools today  Objective: Vital signs in last 24 hours: Temp:  [96.7 F (35.9 C)-98.9 F (37.2 C)] 96.7 F (35.9 C) (12/03 1314) Pulse Rate:  [55-96] 58  (12/03 1100) Resp:  [10-24] 12  (12/03 1400) BP: (90-146)/(32-85) 113/39 mmHg (12/03 1400) SpO2:  [96 %-100 %] 98 % (12/03 1400) Weight:  [63.3 kg (139 lb 8.8 oz)-64.864 kg (143 lb)] 143 lb (64.864 kg) (12/03 1314) Weight change:    PE: Unchanged.  Lab Results: Results for orders placed during the hospital encounter of 03/09/11 (from the past 24 hour(s))  HEMOGLOBIN AND HEMATOCRIT, BLOOD     Status: Abnormal   Collection Time   03/09/11  5:43 PM      Component Value Range   Hemoglobin 8.1 (*) 13.0 - 17.0 (g/dL)   HCT 98.1 (*) 19.1 - 52.0 (%)  MRSA PCR SCREENING     Status: Normal   Collection Time   03/09/11  6:26 PM      Component Value Range   MRSA by PCR NEGATIVE  NEGATIVE   HEMOGLOBIN AND HEMATOCRIT, BLOOD     Status: Abnormal   Collection Time   03/09/11  8:27 PM      Component Value Range   Hemoglobin 8.3 (*) 13.0 - 17.0 (g/dL)   HCT 47.8 (*) 29.5 - 52.0 (%)  COMPREHENSIVE METABOLIC PANEL     Status: Abnormal   Collection Time   03/10/11  5:00 AM      Component Value Range   Sodium 139  135 - 145 (mEq/L)   Potassium 4.4  3.5 - 5.1 (mEq/L)   Chloride 108  96 - 112 (mEq/L)   CO2 25  19 - 32 (mEq/L)   Glucose, Bld 89  70 - 99 (mg/dL)   BUN 32 (*) 6 - 23 (mg/dL)   Creatinine, Ser 6.21  0.50 - 1.35 (mg/dL)   Calcium 8.3 (*) 8.4 - 10.5 (mg/dL)   Total Protein 5.3 (*) 6.0 - 8.3 (g/dL)   Albumin 2.7 (*) 3.5 - 5.2 (g/dL)   AST 12  0 - 37 (U/L)   ALT 11  0 - 53 (U/L)   Alkaline Phosphatase 59  39 - 117 (U/L)   Total Bilirubin 0.3  0.3 - 1.2 (mg/dL)   GFR calc non Af Amer 63 (*) >90 (mL/min)   GFR calc Af Amer 73 (*) >90 (mL/min)  CBC     Status: Abnormal   Collection Time   03/10/11  5:00 AM      Component Value Range   WBC 5.8  4.0 - 10.5  (K/uL)   RBC 2.41 (*) 4.22 - 5.81 (MIL/uL)   Hemoglobin 7.5 (*) 13.0 - 17.0 (g/dL)   HCT 30.8 (*) 65.7 - 52.0 (%)   MCV 93.4  78.0 - 100.0 (fL)   MCH 31.1  26.0 - 34.0 (pg)   MCHC 33.3  30.0 - 36.0 (g/dL)   RDW 84.6  96.2 - 95.2 (%)   Platelets 157  150 - 400 (K/uL)  PROTIME-INR     Status: Normal   Collection Time   03/10/11  5:00 AM      Component Value Range   Prothrombin Time 13.8  11.6 - 15.2 (seconds)   INR 1.04  0.00 - 1.49     Studies/Results: Dg Abd Acute W/chest  03/09/2011  *RADIOLOGY REPORT*  Clinical Data: Vomiting, weakness.  ACUTE ABDOMEN SERIES (ABDOMEN 2 VIEW & CHEST 1 VIEW)  Comparison: None.  Findings: Prior CABG.  Heart is normal size.  Scarring in the left lung base.  Right lung is clear.  No effusions.  Moderate stool burden throughout the colon.  No obstruction or free air.  Prior cholecystectomy.  No organomegaly or suspicious calcification. Degenerative changes in the lumbar spine.  IMPRESSION: Moderate stool burden throughout the colon.  No obstruction or free air.  Left basilar scarring.  Original Report Authenticated By: Cyndie Chime, M.D.      Assessment: Gastric ulcer possibly responsible for previous hematemesis no active bleeding currently  Plan: Treated with double dose proton pump inhibitor and we'll check H. pylori status.    Christian [name] C 03/10/2011, 2:11 PM

## 2011-03-10 NOTE — Progress Notes (Signed)
   CARE MANAGEMENT NOTE 03/10/2011  Patient:  ARIAS, WEINERT   Account Number:  [id number]  Date Initiated:  03/10/2011  Documentation initiated by:  Onnie Boer  Subjective/Objective Assessment:   PT WAS ADMITTED WITH GI BLD     Action/Plan:   PROGRESSION OF CARE AND DISCHARGE PLANNING   Anticipated DC Date:  03/14/2011   Anticipated DC Plan:  HOME W HOME HEALTH SERVICES      DC Planning Services  CM consult      Choice offered to / List presented to:             Status of service:  In process, will continue to follow Medicare Important Message given?   (If response is "NO", the following Medicare IM given date fields will be blank) Date Medicare IM given:   Date Additional Medicare IM given:    Discharge Disposition:    Per UR Regulation:  Reviewed for med. necessity/level of care/duration of stay  Comments:  UR COMPLETED 03/10/2011 Onnie Boer, RN, BSN 1441 PT WAS ADMITTED IWTH GI BLD FROM HOME WITH SELF/ FAMILY CARE

## 2011-03-10 NOTE — Consult Note (Signed)
CARDIOLOGY CONSULT NOTE  Patient ID: Christian [name] MRN: [id number], DOB/AGE: 04-09-1931   Admit date: 03/09/2011 Date of Consult: 03/10/2011   Primary Physician: Kerby Nora, MD, MD Primary Cardiologist: Bensimhon  Pt. Profile: 77 year old male presented to the ED with complaints of dizziness and vomited obvious blood while on stretcher in triage.  Problem List: Past Medical History  Diagnosis Date  . COPD (chronic obstructive pulmonary disease)   . Hyperlipidemia   . Hypertension   . Carotid stenosis     Past Surgical History  Procedure Date  . Appendectomy   . Prostate surgery     nodules benign  . Kidney lithotripsy   . Thumb surgery   . Cholecystectomy   . Coronary artery bypass graft      Allergies: No Known Allergies  HPI: This 77 year old gentleman with hx of CAD s/p CABG and cardiac cath in February 2012 when a DES was placed to the OM-2 and was placed on ASA and Plavix therapy presented to the ED on 12/2 complaining of dizziness and headache.  While in triage in the ED the patient vomited frank blood.  He reports having a black stool this past Thursday.  No prior hx of GI bleed or other NSAID use.  He was admitted to the hospitalist service for management of hematemesis.  They have consulted the GI service who performed an upper endoscopy today which found a linear ulcer with focal erythema within a 3 cm hiatal hernia.  They have trended his H/H which has continued to drop from 10.1 on admission to 8.3 yesterday evening to 7.5 this morning.  GI has planned to double dose PPI, look for H. Pylori antibodies, and treat if necessary.  Cardiology is being consulted re: his significant CAD history and the stopping of his ASA and Plavix.    Inpatient Medications:     . pantoprazole (PROTONIX) IV  40 mg Intravenous Q12H  . DISCONTD: pantoprazole (PROTONIX) IV  40 mg Intravenous Q12H  . DISCONTD: sodium chloride  500 mL Intravenous Once    Family History  Problem  Relation Age of Onset  . Heart disease Mother     CAD  . Heart disease Father   . Emphysema Father      History   Social History  . Marital Status: Widowed    Spouse Name: N/A    Number of Children: 5  . Years of Education: N/A   Occupational History  . ODD JOBS    Social History Main Topics  . Smoking status: Former Smoker    Types: Cigarettes    Quit date: 06/10/2001  . Smokeless tobacco: Not on file  . Alcohol Use: Yes  . Drug Use: No  . Sexually Active: Not on file   Other Topics Concern  . Not on file   Social History Narrative  . No narrative on file     Review of Systems: General: negative for chills, fever, night sweats or weight changes.  Cardiovascular: negative for chest pain, dyspnea on exertion, edema, orthopnea, palpitations, paroxysmal nocturnal dyspnea or shortness of breath Respiratory: negative for cough or wheezing Urologic: negative for hematuria Abdominal: negative for nausea, vomiting, diarrhea, bright red blood per rectum, melena, or hematemesis at present Neurologic: negative for visual changes, syncope, or dizziness All other systems reviewed and are otherwise negative except as noted above.  Physical Exam: Blood pressure 108/45, pulse 58, temperature 96.7 F (35.9 C), temperature source Oral, resp. rate 16, height  5\' 3"  (1.6 m), weight 64.864 kg (143 lb), SpO2 96.00%.  General: Well developed, well nourished, in no acute distress. Head: Normocephalic, atraumatic, sclera non-icteric, no xanthomas, nares are without discharge.  Neck: Supple without bruits or JVD. Lungs:  Resp regular and unlabored, CTA. Heart: RRR no s3, s4, or murmurs. Abdomen: Soft, non-tender, non-distended, BS + x 4.  Msk:  Strength and tone appears normal for age. Extremities: No clubbing, cyanosis or edema. DP/PT/Radials 2+ and equal bilaterally. Neuro: Alert and oriented X 3. Moves all extremities spontaneously. Psych: Normal affect.   Labs:   Results for  orders placed during the hospital encounter of 03/09/11 (from the past 72 hour(s))  SAMPLE TO BLOOD BANK     Status: Normal   Collection Time   03/09/11 11:25 AM      Component Value Range Comment   Blood Bank Specimen SAMPLE AVAILABLE FOR TESTING      Sample Expiration 03/10/2011     CBC     Status: Abnormal   Collection Time   03/09/11 12:00 PM      Component Value Range Comment   WBC 13.4 (*) 4.0 - 10.5 (K/uL)    RBC 3.18 (*) 4.22 - 5.81 (MIL/uL)    Hemoglobin 10.1 (*) 13.0 - 17.0 (g/dL)    HCT 09.8 (*) 11.9 - 52.0 (%)    MCV 94.3  78.0 - 100.0 (fL)    MCH 31.8  26.0 - 34.0 (pg)    MCHC 33.7  30.0 - 36.0 (g/dL)    RDW 14.7  82.9 - 56.2 (%)    Platelets 241  150 - 400 (K/uL)   DIFFERENTIAL     Status: Abnormal   Collection Time   03/09/11 12:00 PM      Component Value Range Comment   Neutrophils Relative 61  43 - 77 (%)    Neutro Abs 8.2 (*) 1.7 - 7.7 (K/uL)    Lymphocytes Relative 27  12 - 46 (%)    Lymphs Abs 3.6  0.7 - 4.0 (K/uL)    Monocytes Relative 7  3 - 12 (%)    Monocytes Absolute 1.0  0.1 - 1.0 (K/uL)    Eosinophils Relative 4  0 - 5 (%)    Eosinophils Absolute 0.5  0.0 - 0.7 (K/uL)    Basophils Relative 1  0 - 1 (%)    Basophils Absolute 0.1  0.0 - 0.1 (K/uL)   COMPREHENSIVE METABOLIC PANEL     Status: Abnormal   Collection Time   03/09/11 12:00 PM      Component Value Range Comment   Sodium 137  135 - 145 (mEq/L)    Potassium 4.1  3.5 - 5.1 (mEq/L)    Chloride 108  96 - 112 (mEq/L)    CO2 19  19 - 32 (mEq/L)    Glucose, Bld 144 (*) 70 - 99 (mg/dL)    BUN 33 (*) 6 - 23 (mg/dL)    Creatinine, Ser 1.30  0.50 - 1.35 (mg/dL)    Calcium 8.7  8.4 - 10.5 (mg/dL)    Total Protein 6.1  6.0 - 8.3 (g/dL)    Albumin 2.9 (*) 3.5 - 5.2 (g/dL)    AST 15  0 - 37 (U/L)    ALT 14  0 - 53 (U/L)    Alkaline Phosphatase 85  39 - 117 (U/L)    Total Bilirubin 0.3  0.3 - 1.2 (mg/dL)    GFR calc non Af Amer 69 (*) >90 (  mL/min)    GFR calc Af Amer 80 (*) >90 (mL/min)   LIPASE,  BLOOD     Status: Normal   Collection Time   03/09/11 12:00 PM      Component Value Range Comment   Lipase 48  11 - 59 (U/L)   PROTIME-INR     Status: Normal   Collection Time   03/09/11 12:00 PM      Component Value Range Comment   Prothrombin Time 14.2  11.6 - 15.2 (seconds)    INR 1.08  0.00 - 1.49    APTT     Status: Normal   Collection Time   03/09/11 12:00 PM      Component Value Range Comment   aPTT 26  24 - 37 (seconds)   OCCULT BLOOD, POC DEVICE     Status: Normal   Collection Time   03/09/11 12:13 PM      Component Value Range Comment   Fecal Occult Bld NEGATIVE     URINALYSIS, ROUTINE W REFLEX MICROSCOPIC     Status: Abnormal   Collection Time   03/09/11  1:36 PM      Component Value Range Comment   Color, Urine YELLOW  YELLOW     APPearance CLEAR  CLEAR     Specific Gravity, Urine 1.023  1.005 - 1.030     pH 5.0  5.0 - 8.0     Glucose, UA NEGATIVE  NEGATIVE (mg/dL)    Hgb urine dipstick NEGATIVE  NEGATIVE     Bilirubin Urine NEGATIVE  NEGATIVE     Ketones, ur NEGATIVE  NEGATIVE (mg/dL)    Protein, ur NEGATIVE  NEGATIVE (mg/dL)    Urobilinogen, UA 0.2  0.0 - 1.0 (mg/dL)    Nitrite NEGATIVE  NEGATIVE     Leukocytes, UA MODERATE (*) NEGATIVE    URINE MICROSCOPIC-ADD ON     Status: Normal   Collection Time   03/09/11  1:36 PM      Component Value Range Comment   WBC, UA 7-10  <3 (WBC/hpf)    Bacteria, UA RARE  RARE    HEMOGLOBIN AND HEMATOCRIT, BLOOD     Status: Abnormal   Collection Time   03/09/11  5:43 PM      Component Value Range Comment   Hemoglobin 8.1 (*) 13.0 - 17.0 (g/dL) DELTA CHECK NOTED   HCT 23.9 (*) 39.0 - 52.0 (%)   MRSA PCR SCREENING     Status: Normal   Collection Time   03/09/11  6:26 PM      Component Value Range Comment   MRSA by PCR NEGATIVE  NEGATIVE    HEMOGLOBIN AND HEMATOCRIT, BLOOD     Status: Abnormal   Collection Time   03/09/11  8:27 PM      Component Value Range Comment   Hemoglobin 8.3 (*) 13.0 - 17.0 (g/dL)    HCT 16.1 (*)  09.6 - 52.0 (%)   COMPREHENSIVE METABOLIC PANEL     Status: Abnormal   Collection Time   03/10/11  5:00 AM      Component Value Range Comment   Sodium 139  135 - 145 (mEq/L)    Potassium 4.4  3.5 - 5.1 (mEq/L)    Chloride 108  96 - 112 (mEq/L)    CO2 25  19 - 32 (mEq/L)    Glucose, Bld 89  70 - 99 (mg/dL)    BUN 32 (*) 6 - 23 (mg/dL)    Creatinine, Ser 0.45  0.50 - 1.35 (mg/dL)    Calcium 8.3 (*) 8.4 - 10.5 (mg/dL)    Total Protein 5.3 (*) 6.0 - 8.3 (g/dL)    Albumin 2.7 (*) 3.5 - 5.2 (g/dL)    AST 12  0 - 37 (U/L)    ALT 11  0 - 53 (U/L)    Alkaline Phosphatase 59  39 - 117 (U/L)    Total Bilirubin 0.3  0.3 - 1.2 (mg/dL)    GFR calc non Af Amer 63 (*) >90 (mL/min)    GFR calc Af Amer 73 (*) >90 (mL/min)   CBC     Status: Abnormal   Collection Time   03/10/11  5:00 AM      Component Value Range Comment   WBC 5.8  4.0 - 10.5 (K/uL)    RBC 2.41 (*) 4.22 - 5.81 (MIL/uL)    Hemoglobin 7.5 (*) 13.0 - 17.0 (g/dL)    HCT 45.4 (*) 09.8 - 52.0 (%)    MCV 93.4  78.0 - 100.0 (fL)    MCH 31.1  26.0 - 34.0 (pg)    MCHC 33.3  30.0 - 36.0 (g/dL)    RDW 11.9  14.7 - 82.9 (%)    Platelets 157  150 - 400 (K/uL) DELTA CHECK NOTED  PROTIME-INR     Status: Normal   Collection Time   03/10/11  5:00 AM      Component Value Range Comment   Prothrombin Time 13.8  11.6 - 15.2 (seconds)    INR 1.04  0.00 - 1.49    PREPARE RBC (CROSSMATCH)     Status: Normal   Collection Time   03/10/11  4:30 PM      Component Value Range Comment   Order Confirmation ORDER PROCESSED BY BLOOD BANK       Radiology/Studies: Dg Abd Acute W/chest  03/09/2011  *RADIOLOGY REPORT*  Clinical Data: Vomiting, weakness.  ACUTE ABDOMEN SERIES (ABDOMEN 2 VIEW & CHEST 1 VIEW)  Comparison: None.  Findings: Prior CABG.  Heart is normal size.  Scarring in the left lung base.  Right lung is clear.  No effusions.  Moderate stool burden throughout the colon.  No obstruction or free air.  Prior cholecystectomy.  No organomegaly or  suspicious calcification. Degenerative changes in the lumbar spine.  IMPRESSION: Moderate stool burden throughout the colon.  No obstruction or free air.  Left basilar scarring.  Original Report Authenticated By: Cyndie Chime, M.D.   03/10/2011: Upper endoscopy-- FINDINGS:  Linear ulcer with focal erythema within the hiatal hernia. 3 cm hiatal hernia otherwise normal study. RECOMMENDATIONS: Treat with double dose proton pump inhibitor and we'll check H. Pylori antibody and treat for eradication if positive. --Dorena Cookey.   ASSESSMENT AND PLAN:  1) GI Bleed: In the setting of CAD s/p cath in February 2012 with placement of DES to OM-2, on ASA and Plavix.  The patient has had marked drops in his H/H over the past 24 hours but is only 10 months out from placement of the DES at [facility].  Plavix would be contraindicated at this point but patient should remain on 81 mg ASA daily to prevent thrombosis of stent.  Primary team is watching H/H closely and his stool was negative for blood.  2) CAD-- continue to hold BB given bradycardia and lower end BPs in the setting of GI bleed.  He's had no complains of CP or anginal equivalents.  Given his hx and current problem would monitor closely for any  signs of angina.    Signed, Nicolasa Ducking, NP 03/10/2011, 4:25 PM  Patient seen and examined with Ward Givens, NP and Loletha Grayer, NP student. We discussed all aspects of the encounter. I agree with the assessment and plan as stated above.  Pt well known to me from clinic. He is s/p DES to OM-1 10 months ago. Now presents with significant UGIB with ulcer on EGD. Ideally would like to continue plavix for 1 year post stent but given ulcer with recent hemorrhage risk/benefit ratio likely not in his favor and thus would hold plavix for at least 2-4 weeks. Would like to resume asa 81 now if ok with GI. We discussed risks of acute stent thrombosis. Will follow.   Daniel BensimhonMD 5:09 PM

## 2011-03-11 DIAGNOSIS — K920 Hematemesis: Secondary | ICD-10-CM

## 2011-03-11 LAB — TYPE AND SCREEN: Unit division: 0

## 2011-03-11 LAB — CBC
Hemoglobin: 9.7 g/dL — ABNORMAL LOW (ref 13.0–17.0)
MCH: 30.8 pg (ref 26.0–34.0)
RBC: 3.15 MIL/uL — ABNORMAL LOW (ref 4.22–5.81)
WBC: 7.2 10*3/uL (ref 4.0–10.5)

## 2011-03-11 LAB — BASIC METABOLIC PANEL
GFR calc non Af Amer: 64 mL/min — ABNORMAL LOW (ref >90)
Glucose, Bld: 90 mg/dL (ref 70–99)
Potassium: 4.1 mEq/L (ref 3.5–5.1)
Sodium: 138 mEq/L (ref 135–145)

## 2011-03-11 MED ORDER — PANTOPRAZOLE SODIUM 40 MG PO TBEC: 40.0000 mg | DELAYED_RELEASE_TABLET | Freq: Two times a day (BID) | ORAL | Status: DC

## 2011-03-11 NOTE — Progress Notes (Signed)
Clinically improved. Hgb stable after transfusion. VSS. Pt tolerating solid diet and no further melena and endorses would like to go home. Cardiology recommends holding Plavix additional 2-4 weeks due to acute GIB but will need to be on baby ASA.  Await GI clearance for discharge. Will need to follow up with cardiology and GI after discharge. Full discharge note to follow.  Junious Silk, ANP [phone]

## 2011-03-11 NOTE — Progress Notes (Signed)
SPOKE WITH THE PATIENT WHO STATES THAT HE IS VERY ACTIVE AT HOME WITH CLEANING, COOKING AND YARD WORK.  PT HAS NO DC NEEDS AT THIS TIME.  Christian [name] 03/11/2011 [phone] OR [phone]

## 2011-03-11 NOTE — Discharge Summary (Signed)
I have examined the patient and reviewed the chart and spoken with Dr Madilyn Fireman. He is stable to be discharged home and he is to follow up with Dr Madilyn Fireman in 3-4 wks.

## 2011-03-11 NOTE — Discharge Summary (Signed)
DISCHARGE SUMMARY  Christian [name]  MR#: [id number]  DOB:Jan 31, 1932  Date of Admission: 03/09/2011 Date of Discharge: 03/11/2011  Attending Physician: Calvert Cantor  Patient's PCP:Amy Ermalene Searing, MD, MD  Consults:  Shirley Friar, MD (Gastroetnerology) Barrie Folk, MD (Gastroenterology) Dolores Patty, MD (Cardiology)  Discharge Diagnoses: Principal Problem:  *GI bleeding/Melena Active Problems:  Symptomatic Anemia associated with acute blood loss/Headache  S/P CABG (coronary artery bypass graft)/ S/P coronary artery stent placement  HYPERTENSION  COPD (chronic obstructive pulmonary disease)   Radiology: Dg Abd Acute W/chest  03/09/2011  *RADIOLOGY REPORT*  Clinical Data: Vomiting, weakness.  ACUTE ABDOMEN SERIES (ABDOMEN 2 VIEW & CHEST 1 VIEW)  Comparison: None.  Findings: Prior CABG.  Heart is normal size.  Scarring in the left lung base.  Right lung is clear.  No effusions.  Moderate stool burden throughout the colon.  No obstruction or free air.  Prior cholecystectomy.  No organomegaly or suspicious calcification. Degenerative changes in the lumbar spine.  IMPRESSION: Moderate stool burden throughout the colon.  No obstruction or free air.  Left basilar scarring.  Original Report Authenticated By: Cyndie Chime, M.D.    Laboratory: Results for orders placed during the hospital encounter of 03/09/11 (from the past 48 hour(s))  URINALYSIS, ROUTINE W REFLEX MICROSCOPIC     Status: Abnormal   Collection Time   03/09/11  1:36 PM      Component Value Range Comment   Color, Urine YELLOW  YELLOW     APPearance CLEAR  CLEAR     Specific Gravity, Urine 1.023  1.005 - 1.030     pH 5.0  5.0 - 8.0     Glucose, UA NEGATIVE  NEGATIVE (mg/dL)    Hgb urine dipstick NEGATIVE  NEGATIVE     Bilirubin Urine NEGATIVE  NEGATIVE     Ketones, ur NEGATIVE  NEGATIVE (mg/dL)    Protein, ur NEGATIVE  NEGATIVE (mg/dL)    Urobilinogen, UA 0.2  0.0 - 1.0 (mg/dL)    Nitrite NEGATIVE  NEGATIVE      Leukocytes, UA MODERATE (*) NEGATIVE    URINE MICROSCOPIC-ADD ON     Status: Normal   Collection Time   03/09/11  1:36 PM      Component Value Range Comment   WBC, UA 7-10  <3 (WBC/hpf)    Bacteria, UA RARE  RARE    HEMOGLOBIN AND HEMATOCRIT, BLOOD     Status: Abnormal   Collection Time   03/09/11  5:43 PM      Component Value Range Comment   Hemoglobin 8.1 (*) 13.0 - 17.0 (g/dL) DELTA CHECK NOTED   HCT 23.9 (*) 39.0 - 52.0 (%)   MRSA PCR SCREENING     Status: Normal   Collection Time   03/09/11  6:26 PM      Component Value Range Comment   MRSA by PCR NEGATIVE  NEGATIVE    HEMOGLOBIN AND HEMATOCRIT, BLOOD     Status: Abnormal   Collection Time   03/09/11  8:27 PM      Component Value Range Comment   Hemoglobin 8.3 (*) 13.0 - 17.0 (g/dL)    HCT 11.9 (*) 14.7 - 52.0 (%)   COMPREHENSIVE METABOLIC PANEL     Status: Abnormal   Collection Time   03/10/11  5:00 AM      Component Value Range Comment   Sodium 139  135 - 145 (mEq/L)    Potassium 4.4  3.5 - 5.1 (mEq/L)  Chloride 108  96 - 112 (mEq/L)    CO2 25  19 - 32 (mEq/L)    Glucose, Bld 89  70 - 99 (mg/dL)    BUN 32 (*) 6 - 23 (mg/dL)    Creatinine, Ser 1.61  0.50 - 1.35 (mg/dL)    Calcium 8.3 (*) 8.4 - 10.5 (mg/dL)    Total Protein 5.3 (*) 6.0 - 8.3 (g/dL)    Albumin 2.7 (*) 3.5 - 5.2 (g/dL)    AST 12  0 - 37 (U/L)    ALT 11  0 - 53 (U/L)    Alkaline Phosphatase 59  39 - 117 (U/L)    Total Bilirubin 0.3  0.3 - 1.2 (mg/dL)    GFR calc non Af Amer 63 (*) >90 (mL/min)    GFR calc Af Amer 73 (*) >90 (mL/min)   CBC     Status: Abnormal   Collection Time   03/10/11  5:00 AM      Component Value Range Comment   WBC 5.8  4.0 - 10.5 (K/uL)    RBC 2.41 (*) 4.22 - 5.81 (MIL/uL)    Hemoglobin 7.5 (*) 13.0 - 17.0 (g/dL)    HCT 09.6 (*) 04.5 - 52.0 (%)    MCV 93.4  78.0 - 100.0 (fL)    MCH 31.1  26.0 - 34.0 (pg)    MCHC 33.3  30.0 - 36.0 (g/dL)    RDW 40.9  81.1 - 91.4 (%)    Platelets 157  150 - 400 (K/uL) DELTA CHECK NOTED    PROTIME-INR     Status: Normal   Collection Time   03/10/11  5:00 AM      Component Value Range Comment   Prothrombin Time 13.8  11.6 - 15.2 (seconds)    INR 1.04  0.00 - 1.49    PREPARE RBC (CROSSMATCH)     Status: Normal   Collection Time   03/10/11  4:30 PM      Component Value Range Comment   Order Confirmation ORDER PROCESSED BY BLOOD BANK     BASIC METABOLIC PANEL     Status: Abnormal   Collection Time   03/11/11  4:30 AM      Component Value Range Comment   Sodium 138  135 - 145 (mEq/L)    Potassium 4.1  3.5 - 5.1 (mEq/L)    Chloride 108  96 - 112 (mEq/L)    CO2 25  19 - 32 (mEq/L)    Glucose, Bld 90  70 - 99 (mg/dL)    BUN 24 (*) 6 - 23 (mg/dL)    Creatinine, Ser 7.82  0.50 - 1.35 (mg/dL)    Calcium 8.4  8.4 - 10.5 (mg/dL)    GFR calc non Af Amer 64 (*) >90 (mL/min)    GFR calc Af Amer 74 (*) >90 (mL/min)   CBC     Status: Abnormal   Collection Time   03/11/11  4:30 AM      Component Value Range Comment   WBC 7.2  4.0 - 10.5 (K/uL)    RBC 3.15 (*) 4.22 - 5.81 (MIL/uL)    Hemoglobin 9.7 (*) 13.0 - 17.0 (g/dL) POST TRANSFUSION SPECIMEN   HCT 28.0 (*) 39.0 - 52.0 (%)    MCV 88.9  78.0 - 100.0 (fL)    MCH 30.8  26.0 - 34.0 (pg)    MCHC 34.6  30.0 - 36.0 (g/dL)    RDW 95.6  21.3 - 08.6 (%)  Platelets 127 (*) 150 - 400 (K/uL)      Current Discharge Medication List    START taking these medications   Details  pantoprazole (PROTONIX) 40 MG tablet Take 1 tablet (40 mg total) by mouth 2 (two) times daily. Qty: 60 tablet, Refills: 1      CONTINUE these medications which have NOT CHANGED   Details  aspirin 81 MG tablet Take 81 mg by mouth daily.     atorvastatin (LIPITOR) 40 MG tablet Take 40 mg by mouth daily.      carvedilol (COREG) 6.25 MG tablet Take 6.25 mg by mouth 2 (two) times daily.     lisinopril (PRINIVIL,ZESTRIL) 40 MG tablet Take 40 mg by mouth daily.        STOP taking these medications     clopidogrel (PLAVIX) 75 MG tablet        History of  Present Illness: Presented to the hospital with complaints of dizziness and a right-sided headache for 5 days. After arrival to the ER was witnessed to have one episode of bloody emesis. Past medical history significant for patient on aspirin and Plavix after undergoing drug-eluting stent placement in February 2012 [facility] hospitals. In addition he had undergone cholecystectomy in February 2012. In regards to current symptomatology he endorses a moderate right-sided headache, denied using any additional NSAID products. He has noted dark stools for several days prior to onset of the dizziness. Denied abdominal pain although he endorses a sensation of choking or food sticking when eating or coughing he also endorsed blurred vision. Laboratory data in the ER revealed a mild leukocytosis with a white count of 13,400 hemoglobin stable at 10.1 baseline hemoglobin unknown and platelets are stable at 241,000. Electrolyte panel was within normal limits except for mildly elevated BUN. Liver function studies were normal. Lipase was normal at 48 PT INR were normal at 14.2 and 1.08 PTT was normal at 26. Fecal occult blood in the ER was negative. Acute abdominal series showed moderate stool bordering throughout the colon without obstruction or free air chest x-ray portion was negative except for left basilar scarring. Clinically he was stable hemodynamically with a BP of 112/46 pulse slightly elevated at 96 he was in no respiratory distress and pulse ox is 99% on room air. Physical exam was unremarkable  Hospital Course: Principal Problem:  *GI bleeding/ Melena Gastroenterology was consulted this admission. Patient underwent endoscopy on 03/10/2011 this revealed a gastric ulcer and the cardia of the stomach he had been empirically placed on proton pump inhibitor twice a day and this was continued throughout the hospitalization in the first 24 hours after admission he was continuing with melena but by day of discharge the melena  has resolved. Also on the date of discharge patient was tolerating a solid diet without any abdominal pain nausea vomiting or emesis. He is to followup with Dr. Madilyn Fireman in 2 weeks after discharge she is to call for an appointment.  Active Problems:  Symptomatic Anemia associated with acute blood loss/Headache His presenting hemoglobin was 10.1 with a nadir of 7.5. He subsequently received 2 units of packed red blood cells this admission. Most recent hemoglobin on 03/10/2011 was 9.7 platelet count is somewhat low at 127,000 but suspect this is related to recent bleeding as well as receipt of blood products. Recommend patient have a CBC repeated after discharge.   S/P CABG (coronary artery bypass graft)/S/P coronary artery stent placement He has been asymptomatic this admission regarding any cardiac ischemic type symptoms appear.  Because he had a drug alluding stent placed in favor of 2012 a cardiology consult was requested in regards to recommendations for his previous Plavix and aspirin therapy. Dr. Clarise Cruz who knows the patient well evaluated the patient on 03/10/2011 and noted that the patient is only 10 months out from placement of this drug alluding stent at [facility] and that in the absence of GI bleeding aspirin Plavix should be continued. Unfortunately Plavix is currently contraindicated due to active GI bleeding and gastric ulcer at presentation therefore the patient should remain on 81 mg of aspirin to prevent thrombosis of the stent. Per cardiology note on the date of discharge we are to continue the baby aspirin at discharge the patient is to remain off of Plavix for the next 2-4 weeks. We have recommended the patient followup with the cardiology group/Dr. Benshimon in the next 2-3 weeks for reevaluation to determine if it is appropriate to resume Plavix at that time.   HYPERTENSION Due to symptomatic acute blood loss anemia patient's blood pressure was relatively low and therefore his usual  antihypertensive medications were held. After replacement of blood for volume as well as fluid volume patient's blood pressure has returned to normotensive and slightly hypertensive range is therefore we will resume his usual antihypertensive medication after discharge.   COPD (chronic obstructive pulmonary disease) This problem has remained quiescent this admission with no evidence of exacerbation per   Day of Discharge BP 130/112  Pulse 57  Temp(Src) 98.1 F (36.7 C) (Oral)  Resp 21  Ht 5\' 3"  (1.6 m)  Wt 64.1 kg (141 lb 5 oz)  BMI 25.03 kg/m2  SpO2 100%  Physical Exam:  General appearance: alert, cooperative, appears stated age and no distress Resp: clear to auscultation bilaterally Cardio: regular rate and rhythm, S1, S2 normal, no murmur, click, rub or gallop GI: soft, non-tender; bowel sounds normal; no masses,  no organomegaly, tolerating solid diet and no further melena Extremities: extremities normal, atraumatic, no cyanosis or edema Neurologic: Alert and oriented X 3, normal strength and tone. Normal symmetric reflexes. Normal coordination and gait  Follow-up: He is to call his primary care physician regarding routine hospital followup in the next 1-2 weeks He is to call the gastroenterology team/Dr. Madilyn Fireman to schedule followup in the next one to 2 weeks. He is to call the cardiology team/Dr. Benshimon noted to schedule followup appointment in the next 3 weeks.  Disposition:  He will discharge home via private vehicle with his family.   Junious Silk, ANP pager [phone]

## 2011-03-11 NOTE — Progress Notes (Signed)
Patient ID: Christian [name], male   DOB: [birth year], 77 y.o.   MRN: [id number] @ Subjective:  Denies SSCP, palpitations or Dyspnea Headache is gone.  Objective:  Filed Vitals:   03/11/11 0053 03/11/11 0153 03/11/11 0235 03/11/11 0500  BP:   121/54   Pulse:      Temp: 98 F (36.7 C) 97.8 F (36.6 C) 97.8 F (36.6 C) 98.5 F (36.9 C)  TempSrc:    Oral  Resp:      Height:      Weight:    64.1 kg (141 lb 5 oz)  SpO2:        Intake/Output from previous day:  Intake/Output Summary (Last 24 hours) at 03/11/11 0739 Last data filed at 03/11/11 0600  Gross per 24 hour  Intake   1570 ml  Output   2350 ml  Net   -780 ml    Physical Exam: General appearance: alert and no distress Neck: no adenopathy, no carotid bruit, no JVD, supple, symmetrical, trachea midline and thyroid not enlarged, symmetric, no tenderness/mass/nodules Lungs: clear to auscultation bilaterally Heart: regular rate and rhythm, S1, S2 normal, no murmur, click, rub or gallop Abdomen: soft, non-tender; bowel sounds normal; no masses,  no organomegaly Extremities: extremities normal, atraumatic, no cyanosis or edema Pulses: 2+ and symmetric  Lab Results: Basic Metabolic Panel:  Basename 03/11/11 0430 03/10/11 0500  NA 138 139  K 4.1 4.4  CL 108 108  CO2 25 25  GLUCOSE 90 89  BUN 24* 32*  CREATININE 1.08 1.10  CALCIUM 8.4 8.3*  MG -- --  PHOS -- --   Liver Function Tests:  Basename 03/10/11 0500 03/09/11 1200  AST 12 15  ALT 11 14  ALKPHOS 59 85  BILITOT 0.3 0.3  PROT 5.3* 6.1  ALBUMIN 2.7* 2.9*    Basename 03/09/11 1200  LIPASE 48  AMYLASE --   CBC:  Basename 03/11/11 0430 03/10/11 0500 03/09/11 1200  WBC 7.2 5.8 --  NEUTROABS -- -- 8.2*  HGB 9.7* 7.5* --  HCT 28.0* 22.5* --  MCV 88.9 93.4 --  PLT 127* 157 --   Cardiac Enzymes: No results found for this basename: CKTOTAL:3,CKMB:3,CKMBINDEX:3,TROPONINI:3 in the last 72 hours BNP: No results found for this basename: POCBNP:3 in the  last 72 hours D-Dimer: No results found for this basename: DDIMER:2 in the last 72 hours Hemoglobin A1C: No results found for this basename: HGBA1C in the last 72 hours Fasting Lipid Panel: No results found for this basename: CHOL,HDL,LDLCALC,TRIG,CHOLHDL,LDLDIRECT in the last 72 hours Thyroid Function Tests: No results found for this basename: TSH,T4TOTAL,FREET3,T3FREE,THYROIDAB in the last 72 hours Anemia Panel: No results found for this basename: VITAMINB12,FOLATE,FERRITIN,TIBC,IRON,RETICCTPCT in the last 72 hours  Imaging: Imaging results have been reviewed  Cardiac Studies:  ECG:  NSR no acute changes    Telemetry:  NSR with no arrythmia   Medications:     . aspirin EC  81 mg Oral Daily  . carvedilol  3.125 mg Oral BID WC  . clopidogrel  75 mg Oral Q breakfast  . pantoprazole (PROTONIX) IV  40 mg Intravenous Q12H  . rosuvastatin  20 mg Oral QHS  . DISCONTD: aspirin  81 mg Oral Daily  . DISCONTD: aspirin  81 mg Oral Daily  . DISCONTD: sodium chloride  500 mL Intravenous Once       . sodium chloride 75 mL/hr at 03/11/11 0300    Assessment/Plan:  CAD:  Stent to OM 10 months ago DES.  Plavix  D/C secondary to gi bleed.  No angina.  Continue baby aspirin Anemia:  Ulcer with GI bleed.  S/P transfusion 2 units.  Follow HCT and guaic stools  Plan per GI  Charlton Haws 03/11/2011, 7:39 AM

## 2011-03-12 NOTE — Progress Notes (Signed)
   CARE MANAGEMENT NOTE 03/12/2011  Patient:  Christian [name], Christian [name]   Account Number:  [id number]  Date Initiated:  03/10/2011  Documentation initiated by:  Onnie Boer  Subjective/Objective Assessment:   PT WAS ADMITTED WITH GI BLD     Action/Plan:   PROGRESSION OF CARE AND DISCHARGE PLANNING   Anticipated DC Date:  03/14/2011   Anticipated DC Plan:  HOME W HOME HEALTH SERVICES      DC Planning Services  CM consult      Choice offered to / List presented to:             Status of service:  Completed, signed off Medicare Important Message given?   (If response is "NO", the following Medicare IM given date fields will be blank) Date Medicare IM given:   Date Additional Medicare IM given:    Discharge Disposition:  HOME/SELF CARE  Per UR Regulation:  Reviewed for med. necessity/level of care/duration of stay  Comments:  UR COMPLETED 03/10/2011 Onnie Boer, RN, BSN 1441 PT WAS ADMITTED IWTH GI BLD FROM HOME WITH SELF/ FAMILY CARE  03/12/2011 Onnie Boer, RN, BSN 1216 PT DC'D TO HOME WITH SELF CARE  03/11/2011 Onnie Boer, RN, BSN 1245 SPOKE WITH PT AND HE STATED THAT HE IS VERY ACITVE AT HOME WITH YARD WORK, COOKING, CLEANING THE HOUSE AND HE ALSO TAKES A PAINTING CLASS ON FRIDAYS.  PT HAS NO DC NEEDS AT THIS TIME, WILL F/U.

## 2011-03-13 ENCOUNTER — Encounter (HOSPITAL_COMMUNITY): Payer: Self-pay | Admitting: Gastroenterology

## 2011-03-18 ENCOUNTER — Encounter: Payer: Self-pay | Admitting: Family Medicine

## 2011-03-18 ENCOUNTER — Ambulatory Visit (INDEPENDENT_AMBULATORY_CARE_PROVIDER_SITE_OTHER): Payer: Medicare Other | Admitting: Family Medicine

## 2011-03-18 DIAGNOSIS — D62 Acute posthemorrhagic anemia: Secondary | ICD-10-CM

## 2011-03-18 DIAGNOSIS — R42 Dizziness and giddiness: Secondary | ICD-10-CM | POA: Insufficient documentation

## 2011-03-18 LAB — CBC WITH DIFFERENTIAL/PLATELET
Basophils Relative: 0.8 % (ref 0.0–3.0)
Eosinophils Relative: 7.3 % — ABNORMAL HIGH (ref 0.0–5.0)
HCT: 31.5 % — ABNORMAL LOW (ref 39.0–52.0)
Hemoglobin: 10.8 g/dL — ABNORMAL LOW (ref 13.0–17.0)
Lymphs Abs: 1.2 10*3/uL (ref 0.7–4.0)
MCV: 94 fl (ref 78.0–100.0)
Monocytes Absolute: 0.8 10*3/uL (ref 0.1–1.0)
Neutro Abs: 3.4 10*3/uL (ref 1.4–7.7)
Platelets: 191 10*3/uL (ref 150.0–400.0)
RBC: 3.35 Mil/uL — ABNORMAL LOW (ref 4.22–5.81)
WBC: 5.8 10*3/uL (ref 4.5–10.5)

## 2011-03-18 LAB — BASIC METABOLIC PANEL
BUN: 18 mg/dL (ref 6–23)
CO2: 27 mEq/L (ref 19–32)
Calcium: 8.6 mg/dL (ref 8.4–10.5)
Creatinine, Ser: 1.3 mg/dL (ref 0.4–1.5)
GFR: 59.42 mL/min — ABNORMAL LOW (ref >60.00)
Glucose, Bld: 122 mg/dL — ABNORMAL HIGH (ref 70–99)

## 2011-03-18 LAB — HEPATIC FUNCTION PANEL
Albumin: 3.5 g/dL (ref 3.5–5.2)
Total Protein: 6.2 g/dL (ref 6.0–8.3)

## 2011-03-18 MED ORDER — FERROUS SULFATE 325 (65 FE) MG PO TABS: 325.0000 mg | ORAL_TABLET | Freq: Two times a day (BID) | ORAL | Status: DC

## 2011-03-18 MED ORDER — MECLIZINE HCL 25 MG PO TABS: 25.0000 mg | ORAL_TABLET | Freq: Three times a day (TID) | ORAL | Status: AC | PRN

## 2011-03-18 NOTE — Patient Instructions (Signed)
F/u Dr. Ermalene Searing in 2 weeks

## 2011-03-18 NOTE — Progress Notes (Signed)
Patient Name: Christian [name] Date of Birth: [birth year] Age: 77 y.o. Medical Record Number: [id number] Gender: male  History of Present Illness:  Christian [name] is a 77 y.o. very pleasant male patient who presents with the following:  I was asked to see the patient77 emergently to evaluate him and work him in on an urgent basis by her clinical status as the patient walked into our office acutely feeling dizzy, with recent hospital discharge due to an acute GI bleed and blood loss due to a ulcer.  Bloody emesis at ER, admitted. Had an EGD, has a bleeding ulcer. Was given 2 pints of blood. Since leaving the hospital, he has not had any blood in emesis or stool. No dark stools now. Taking iron pills (got OTC)  3 weeks ago started to feel dizzy. Was moving slow at the time of discharge --   The patient feels notably lightheaded with walking. Notably lightheaded with moving his head and standing up. It improves with sitting. Some worsening with rotational movements of the head. He denies any emesis of blood. He denies any melena or bright red blood per rectum.  Hospital admission and discharge summaries were all reviewed. Recent laboratory work was all reviewed.  The patient just started taking some over-the-counter iron tablets yesterday. One tablet daily.  CBC:    Component Value Date/Time   WBC 5.8 03/18/2011 1133   HGB 10.8* 03/18/2011 1133   HCT 31.5* 03/18/2011 1133   PLT 191.0 03/18/2011 1133   MCV 94.0 03/18/2011 1133   NEUTROABS 3.4 03/18/2011 1133   LYMPHSABS 1.2 03/18/2011 1133   MONOABS 0.8 03/18/2011 1133   EOSABS 0.4 03/18/2011 1133   BASOSABS 0.0 03/18/2011 1133   Comprehensive Metabolic Panel:    Component Value Date/Time   NA 140 03/18/2011 1133   K 4.4 03/18/2011 1133   CL 107 03/18/2011 1133   CO2 27 03/18/2011 1133   BUN 18 03/18/2011 1133   CREATININE 1.3 03/18/2011 1133   GLUCOSE 122* 03/18/2011 1133   CALCIUM 8.6 03/18/2011 1133   AST 18 03/18/2011 1133     ALT 15 03/18/2011 1133   ALKPHOS 76 03/18/2011 1133   BILITOT 0.3 03/18/2011 1133   PROT 6.2 03/18/2011 1133   ALBUMIN 3.5 03/18/2011 1133    Past Medical History, Surgical History, Social History, Family History, and Problem List have been reviewed in EHR and updated if relevant.  Review of Systems: As above. No chest pain. No shortness of breath. No nausea, vomiting, diarrhea. Recent headache.  Physical Examination: Filed Vitals:   03/18/11 1050  BP: 130/60  Pulse: 59  Temp: 97.9 F (36.6 C)  TempSrc: Oral  Height: 5' 3.5" (1.613 m)  Weight: 143 lb (64.864 kg)  SpO2: 98%    Body mass index is 24.93 kg/(m^2).   GEN: WDWN, NAD, Non-toxic, A & O x 3. Mildly pale in appearance.  HEENT: Atraumatic, Normocephalic. Neck supple. No masses, No LAD. Ears and Nose: No external deformity. CV: RRR, No M/G/R. No JVD. No thrill. No extra heart sounds. PULM: CTA B, no wheezes, crackles, rhonchi. No retractions. No resp. distress. No accessory muscle use. EXTR: No c/c/e NEURO Normal gait.  PSYCH: Normally interactive. Conversant. Not depressed or anxious appearing.  Calm demeanor.    Assessment and Plan: 1. Anemia associated with acute blood loss  CBC with Differential, Basic metabolic panel, Hepatic function panel, ferrous sulfate 325 (65 FE) MG tablet  2. Dizziness  CBC with Differential, Basic metabolic panel, Hepatic  function panel, ferrous sulfate 325 (65 FE) MG tablet, meclizine (ANTIVERT) 25 MG tablet    Obtain laboratory evaluation to reassess hemoglobin and other causes for acute dizziness. He denies any palpitations. I suspect that this is all due to his acute blood loss, with incomplete recovery given his acute insult recently. He is taking an 81 mg aspirin per cardiology's recommendations. He has discontinued his Plavix at this point. Of note, the patient has not been taking iron, so I started him on twice a day iron.  At the time of this dictation, all laboratories have  returned, and they are reassuring, with increasing hemoglobin compared to the calm discharge. Also gave him a prescription for a cane, which I think would help with potential fall risk.

## 2011-05-14 ENCOUNTER — Other Ambulatory Visit: Payer: Self-pay | Admitting: Family Medicine

## 2011-06-04 ENCOUNTER — Emergency Department (HOSPITAL_COMMUNITY)
Admission: EM | Admit: 2011-06-04 | Discharge: 2011-06-04 | Disposition: A | Discharge status: Home / Self Care | Payer: Medicare Other | Attending: Emergency Medicine | Admitting: Emergency Medicine

## 2011-06-04 ENCOUNTER — Emergency Department (HOSPITAL_COMMUNITY): Payer: Medicare Other

## 2011-06-04 ENCOUNTER — Other Ambulatory Visit: Payer: Self-pay

## 2011-06-04 ENCOUNTER — Encounter (HOSPITAL_COMMUNITY): Payer: Self-pay | Admitting: *Deleted

## 2011-06-04 DIAGNOSIS — I10 Essential (primary) hypertension: Secondary | ICD-10-CM | POA: Insufficient documentation

## 2011-06-04 DIAGNOSIS — I7789 Other specified disorders of arteries and arterioles: Secondary | ICD-10-CM | POA: Insufficient documentation

## 2011-06-04 DIAGNOSIS — Z7982 Long term (current) use of aspirin: Secondary | ICD-10-CM | POA: Insufficient documentation

## 2011-06-04 DIAGNOSIS — Z9889 Other specified postprocedural states: Secondary | ICD-10-CM | POA: Insufficient documentation

## 2011-06-04 DIAGNOSIS — G8929 Other chronic pain: Secondary | ICD-10-CM

## 2011-06-04 DIAGNOSIS — Z951 Presence of aortocoronary bypass graft: Secondary | ICD-10-CM | POA: Insufficient documentation

## 2011-06-04 DIAGNOSIS — I498 Other specified cardiac arrhythmias: Secondary | ICD-10-CM | POA: Insufficient documentation

## 2011-06-04 DIAGNOSIS — Z87891 Personal history of nicotine dependence: Secondary | ICD-10-CM | POA: Insufficient documentation

## 2011-06-04 DIAGNOSIS — R42 Dizziness and giddiness: Secondary | ICD-10-CM

## 2011-06-04 DIAGNOSIS — M542 Cervicalgia: Secondary | ICD-10-CM | POA: Insufficient documentation

## 2011-06-04 DIAGNOSIS — E785 Hyperlipidemia, unspecified: Secondary | ICD-10-CM | POA: Insufficient documentation

## 2011-06-04 DIAGNOSIS — J449 Chronic obstructive pulmonary disease, unspecified: Secondary | ICD-10-CM | POA: Insufficient documentation

## 2011-06-04 DIAGNOSIS — J4489 Other specified chronic obstructive pulmonary disease: Secondary | ICD-10-CM | POA: Insufficient documentation

## 2011-06-04 MED ORDER — MORPHINE SULFATE 4 MG/ML IJ SOLN
6.0000 mg | Freq: Once | INTRAMUSCULAR | Status: AC
  Administered 2011-06-04: 6 mg via INTRAVENOUS
  Filled 2011-06-04: qty 2

## 2011-06-04 MED ORDER — DIAZEPAM 5 MG PO TABS: 5.0000 mg | ORAL_TABLET | Freq: Two times a day (BID) | ORAL | Status: DC | PRN

## 2011-06-04 MED ORDER — OXYCODONE-ACETAMINOPHEN 5-325 MG PO TABS: 1.0000 | ORAL_TABLET | ORAL | Status: DC | PRN

## 2011-06-04 MED ORDER — ONDANSETRON HCL 4 MG/2ML IJ SOLN
4.0000 mg | Freq: Once | INTRAMUSCULAR | Status: AC
  Administered 2011-06-04: 4 mg via INTRAVENOUS
  Filled 2011-06-04: qty 2

## 2011-06-04 NOTE — ED Provider Notes (Signed)
History    78 year old male with neck pain and headache. Complaints are chronic. They have been going on for a period of at least several months. Patient states that they've been worse for the last couple days though. Pain is in the lateral aspects of either side of his neck. Is worse with rotation to either side. Patient states it feels like there is a "collar around my neck and it feels like it's pushing by bones up into my brain." Patient denies history of significant remote or acute trauma. No fevers or chills. No numbness or tingling. Patient does feel off balance though, particularly when he is walking. Denies any pain anywhere else. No nausea or vomiting. Patient says he feels dizzy which he describes as a sensation that he feels off balance. Describes headache is diffuse and achy. No appreciable exacerbating relieving factors. No acute visual changes. Denies use of blood thinning medication aside from aspirin. Denies history cerebral aneurysm or family history of the same.   CSN: [id number]  Arrival date & time 06/04/11  1340   First MD Initiated Contact with Patient 06/04/11 1501      Chief Complaint  Patient presents with  . Dizziness  . Headache  . Neck Pain    (Consider location/radiation/quality/duration/timing/severity/associated sxs/prior treatment) HPI  Past Medical History  Diagnosis Date  . COPD (chronic obstructive pulmonary disease)   . Hyperlipidemia   . Hypertension   . Carotid stenosis     Past Surgical History  Procedure Date  . Appendectomy   . Prostate surgery     nodules benign  . Kidney lithotripsy   . Thumb surgery   . Cholecystectomy   . Coronary artery bypass graft   . Esophagogastroduodenoscopy 03/10/2011    Procedure: ESOPHAGOGASTRODUODENOSCOPY (EGD);  Surgeon: Barrie Folk, MD;  Location: [facility] ENDOSCOPY;  Service: Endoscopy;  Laterality: N/A;    Family History  Problem Relation Age of Onset  . Heart disease Mother     CAD  . Heart disease  Father   . Emphysema Father     History  Substance Use Topics  . Smoking status: Former Smoker    Types: Cigarettes    Quit date: 06/10/2001  . Smokeless tobacco: Not on file  . Alcohol Use: No     no alcohol for 14 years      Review of Systems   Review of symptoms negative unless otherwise noted in HPI.   Allergies  Review of patient's allergies indicates no known allergies.  Home Medications   Current Outpatient Rx  Name Route Sig Dispense Refill  . ASPIRIN 81 MG PO TABS Oral Take 81 mg by mouth daily.     . ATORVASTATIN CALCIUM 40 MG PO TABS Oral Take 40 mg by mouth daily.      Marland Kitchen CARVEDILOL 6.25 MG PO TABS Oral Take 6.25 mg by mouth 2 (two) times daily.     Marland Kitchen FERROUS SULFATE 325 (65 FE) MG PO TABS Oral Take 1 tablet (325 mg total) by mouth 2 (two) times daily. 60 tablet 3  . LISINOPRIL 40 MG PO TABS Oral Take 40 mg by mouth daily.        BP 156/76  Pulse 65  Temp(Src) 98.8 F (37.1 C) (Oral)  Resp 16  SpO2 95%  Physical Exam  Nursing note and vitals reviewed. Constitutional: He appears well-developed and well-nourished. No distress.  HENT:  Head: Normocephalic and atraumatic.  Eyes: Conjunctivae are normal. Right eye exhibits no discharge. Left eye exhibits  no discharge.  Neck: Neck supple.       Neck is normal limits to inspection. No concerning lesions noted. Patient has full range of motion. No midline spinal tenderness. No paraspinal tenderness is appreciated either. Patient reports pain in the lateral aspect of his neck with rotation of his head to the opposite side.  Cardiovascular: Normal rate, regular rhythm and normal heart sounds.  Exam reveals no gallop and no friction rub.   No murmur heard. Pulmonary/Chest: Effort normal and breath sounds normal. No respiratory distress.  Abdominal: Soft. He exhibits no distension. There is no tenderness.  Musculoskeletal: Normal range of motion. He exhibits no edema and no tenderness.  Neurological: He is alert.        Fairly steady gait but the patient walks with his right foot slightly inverted. Uneven wear of the sole of his R boot. The lateral aspect of the sole of his right shoe is significantly more worn. Romberg's is negative. Patient tends to lean towards right and also backwards. Good finger nose testing bilaterally. Strength is 5 out of 5 upper and lower extremities. Sensation is grossly intact to light touch. Cranial nerves 2-12 all intact.  Skin: Skin is warm and dry.  Psychiatric: He has a normal mood and affect. His behavior is normal. Thought content normal.    ED Course  Procedures (including critical care time)  Labs Reviewed - No data to display Ct Head Wo Contrast  06/04/2011  *RADIOLOGY REPORT*  Clinical Data:  78 year old male with headache, dizziness, neck pain.  CT HEAD WITHOUT CONTRAST CT CERVICAL SPINE WITHOUT CONTRAST  Technique:  Multidetector CT imaging of the head and cervical spine was performed following the standard protocol without intravenous contrast.  Multiplanar CT image reconstructions of the cervical spine were also generated.  Comparison:  Cervical radiographs 01/25/2010.  Head CT 02/10/2007.  CT HEAD  Findings: Chronic paranasal sinus mucoperiosteal thickening. Mastoids are clear. No acute osseous abnormality identified. Visualized orbits and scalp soft tissues are within normal limits.  Calcified atherosclerosis at the skull base.  Some progression of generalized cerebral volume loss is noted.  No ventriculomegaly. No midline shift, mass effect, or evidence of mass lesion.  No acute intracranial hemorrhage identified.  No suspicious intracranial vascular hyperdensity. No evidence of cortically based acute infarction identified.  IMPRESSION: 1. No acute intracranial abnormality.  Age congruent cerebral volume loss with mild progression since 2008. 2.  Cervical findings are below.  CT CERVICAL SPINE  Findings: Straightening of cervical lordosis.  Trace anterolisthesis of  C2-1 C3 and also C7 on T1.  Degenerative ligamentous and osseous hypertrophy at the C1-C2 articulation. Visualized skull base is intact.  No atlanto-occipital dissociation.  Bilateral posterior element alignment is within normal limits.  Cervical vertebral sclerosis appears to be degenerative, with multilevel disc space loss and left greater than right facet hypertrophy.  No acute cervical fracture.  Grossly intact visualized upper thoracic levels.  Calcified atherosclerosis.  This is severe at the right ICA origin.  Other Visualized paraspinal soft tissues are within normal limits. Apical pulmonary paraseptal emphysema.  IMPRESSION: 1.  Widespread cervical degenerative changes. No acute fracture or listhesis identified in the cervical spine.  Ligamentous injury is not excluded. 2.  Severe right ICA origin calcified atherosclerosis, suspected to be hemodynamically significant.  Original Report Authenticated By: Harley Hallmark, M.D.   Ct Cervical Spine Wo Contrast  06/04/2011  *RADIOLOGY REPORT*  Clinical Data:  78 year old male with headache, dizziness, neck pain.  CT HEAD WITHOUT  CONTRAST CT CERVICAL SPINE WITHOUT CONTRAST  Technique:  Multidetector CT imaging of the head and cervical spine was performed following the standard protocol without intravenous contrast.  Multiplanar CT image reconstructions of the cervical spine were also generated.  Comparison:  Cervical radiographs 01/25/2010.  Head CT 02/10/2007.  CT HEAD  Findings: Chronic paranasal sinus mucoperiosteal thickening. Mastoids are clear. No acute osseous abnormality identified. Visualized orbits and scalp soft tissues are within normal limits.  Calcified atherosclerosis at the skull base.  Some progression of generalized cerebral volume loss is noted.  No ventriculomegaly. No midline shift, mass effect, or evidence of mass lesion.  No acute intracranial hemorrhage identified.  No suspicious intracranial vascular hyperdensity. No evidence of  cortically based acute infarction identified.  IMPRESSION: 1. No acute intracranial abnormality.  Age congruent cerebral volume loss with mild progression since 2008. 2.  Cervical findings are below.  CT CERVICAL SPINE  Findings: Straightening of cervical lordosis.  Trace anterolisthesis of C2-1 C3 and also C7 on T1.  Degenerative ligamentous and osseous hypertrophy at the C1-C2 articulation. Visualized skull base is intact.  No atlanto-occipital dissociation.  Bilateral posterior element alignment is within normal limits.  Cervical vertebral sclerosis appears to be degenerative, with multilevel disc space loss and left greater than right facet hypertrophy.  No acute cervical fracture.  Grossly intact visualized upper thoracic levels.  Calcified atherosclerosis.  This is severe at the right ICA origin.  Other Visualized paraspinal soft tissues are within normal limits. Apical pulmonary paraseptal emphysema.  IMPRESSION: 1.  Widespread cervical degenerative changes. No acute fracture or listhesis identified in the cervical spine.  Ligamentous injury is not excluded. 2.  Severe right ICA origin calcified atherosclerosis, suspected to be hemodynamically significant.  Original Report Authenticated By: Ulla Potash III, M.D.    EKG:  Rhythm: Sinus Bradycardia Rate: 56 Axis: Normal axis Intervals: Q waves in lead 3. Abnormal R wave progression ST segments: Normal  4:49 PM Significant right internal carotid artery atherosclerosis is noted on CT. Given the patient's complaint of dizziness, will further evaluate with ultrasound.  5:29 PM Vascular Ultrasound  Carotid Duplex (Doppler) has been completed. Preliminary findings: Right= 60-79% ICA stenosis, mid end of scale. Left= 60-79% ICA stenosis, low to mid end of scale. Bilateral Antegrade vertebral flow.     1. Dizziness   2. Neck pain, chronic       MDM  78 year old male with neck pain and dizziness. These are chronic in nature. CT of the head and  neck did not show any acute abnormalities. Doppler ultrasounds of the carotid arteries were performed given symptoms of dizziness and findings on CT. Ultrasound did show mild to moderate stenosis. Do not suspect that this is the etiology of patient's dizziness. Patient is clinically stable. Referral was given to orthopedic spine. Pain medicine and muscle relaxants as needed. Return precautions were discussed.        Raeford Razor, MD 06/04/11 [id number]

## 2011-06-04 NOTE — Progress Notes (Addendum)
*  PRELIMINARY RESULTS* Vascular Ultrasound Carotid Duplex (Doppler) has been completed.  Preliminary findings: Right= 60-79% ICA stenosis, mid end of scale. Left= 60-79% ICA stenosis, low to mid end of scale. Bilateral Antegrade vertebral flow.  Farrel Demark RDMS 06/04/2011, 5:13 PM

## 2011-06-04 NOTE — ED Notes (Signed)
Patient has had pain in his neck for 7 to 8 mths.  He had a dx of vertebrae "sticking out"  He states he has lost range of motion due to pain.  He has had onset of dizziness and unsteady gait for 2 weeks.  He states today was his worst day.  He denies one sided weakness.

## 2011-06-04 NOTE — Discharge Instructions (Signed)
Dizziness Dizziness is a common problem. It is a feeling of unsteadiness or lightheadedness. You may feel like you are about to faint. Dizziness can lead to injury if you stumble or fall. A person of any age group can suffer from dizziness, but dizziness is more common in older adults. CAUSES  Dizziness can be caused by many different things, including:  Middle ear problems.   Standing for too long.   Infections.   An allergic reaction.   Aging.   An emotional response to something, such as the sight of blood.   Side effects of medicines.   Fatigue.   Problems with circulation or blood pressure.   Excess use of alcohol, medicines, or illegal drug use.   Breathing too fast (hyperventilation).   An arrhythmia or problems with your heart rhythm.   Anemia or a low blood count.   Pregnancy.   Vomiting, diarrhea, fever, or other illnesses that cause dehydration.   Diseases or conditions such as Parkinson's disease, high blood pressure (hypertension), diabetes, and thyroid problems.   Exposure to extreme heat.  DIAGNOSIS  To find the cause of your dizziness, your caregiver may do a physical exam, lab tests, radiologic imaging scans, or an electrocardiography test (ECG).  TREATMENT  Treatment of dizziness depends on the cause of your symptoms and can vary greatly. HOME CARE INSTRUCTIONS   Drink enough fluids to keep your urine clear or pale yellow. This is especially important in very hot weather. In the elderly, it is also important in cold weather.   If your dizziness is caused by medicines, take them exactly as directed. When taking blood pressure medicines, it is especially important to get up slowly.   Rise slowly from chairs and steady yourself until you feel okay.   In the morning, first sit up on the side of the bed. When this seems okay, stand slowly while holding onto something until you know your balance is fine.   If you need to stand in one place for a long  time, be sure to move your legs often. Tighten and relax the muscles in your legs while standing.   If dizziness continues to be a problem, have someone stay with you for a day or two. Do this until you feel you are well enough to stay alone. Have the person call your caregiver if he or she notices changes in you that are concerning.   Do not drive or use heavy machinery if you feel dizzy.  SEEK IMMEDIATE MEDICAL CARE IF:   Your dizziness or lightheadedness gets worse.   You feel nauseous or vomit.   You develop problems with talking, walking, weakness, or using your arms, hands, or legs.   You are not thinking clearly or you have difficulty forming sentences. It may take a friend or family member to determine if your thinking is normal.   You develop chest pain, abdominal pain, shortness of breath, or sweating.   Your vision changes.   You notice any bleeding.   You have side effects from medicine that seems to be getting worse rather than better.  MAKE SURE YOU:   Understand these instructions.   Will watch your condition.   Will get help right away if you are not doing well or get worse.  Document Released: 09/17/2000 Document Revised: 11/26/2010 Document Reviewed: 10/11/2010 [facility] Patient Information 2012 [place], [place].Chronic Pain Chronic pain can be defined as pain that is lasting, off and on, and lasts for 3 to 6  months or longer. Many things cause chronic pain, which can make it difficult to make a discrete diagnosis. There are many treatment options available for chronic pain. However, finding a treatment that works well for you may require trying various approaches until a suitable one is found. CAUSES  In some types of chronic medical conditions, the pain is caused by a normal pain response within the body. A normal pain response helps the body identify illness or injury and prevent further damage from being done. In these cases, the cause of the pain may be  identified and treated, even if it may not be cured completely. Examples of chronic conditions which can cause chronic pain include:  Inflammation of the joints (arthritis).   Back pain or neck pain (including bulging or herniated disks).   Migraine headaches.   Cancer.  In some other types of chronic pain syndromes, the pain is caused by an abnormal pain response within the body. An abnormal pain response is present when there is no ongoing cause (or stimulus) for the pain, or when the cause of the pain is arising from the nerves or nervous system itself. Examples of conditions which can cause chronic pain due to an abnormal pain response include:  Fibromyalgia.   Reflex sympathetic dystrophy (RSD).   Neuropathy (when the nerves themselves are damaged, and may cause pain).  DIAGNOSIS  Your caregiver will help diagnose your condition over time. In many cases, the initial focus will be on excluding conditions that could be causing the pain. Depending on your symptoms, your caregiver may order some tests to diagnose your condition. Some of these tests include:  Blood tests.   Computerized X-ray scans (CT scan).   Computerized magnetic scans (MRI).   X-rays.   Ultrasounds.   Nerve conduction studies.   Consultation with other physicians or specialists.  TREATMENT  There are many treatment options for people suffering from chronic pain. Finding a treatment that works well may take time.   You may be referred to a pain management specialist.   You may be put on medication to help with the pain. Unfortunately, some medications (such as opiate medications) may not be very effective in cases where chronic pain is due to abnormal pain responses. Finding the right medications can take some time.   Adjunctive therapies may be used to provide additional relief and improve a patient's quality of life. These therapies include:   Mindfulness meditation.   Acupuncture.   Biofeedback.    Cognitive-behavioral therapy.   In certain cases, surgical interventions may be attempted.  HOME CARE INSTRUCTIONS   Make sure you understand these instructions prior to discharge.   Ask any questions and share any further concerns you have with your caregiver prior to discharge.   Take all medications as directed by your caregiver.   Keep all follow-up appointments.  SEEK MEDICAL CARE IF:   Your pain gets worse.   You develop a new pain that was not present before.   You cannot tolerate any medications prescribed by your caregiver.   You develop new symptoms since your last visit with your caregiver.  SEEK IMMEDIATE MEDICAL CARE IF:   You develop muscular weakness.   You have decreased sensation or numbness.   You lose control of bowel or bladder function.   Your pain suddenly gets much worse.   You have an oral temperature above 102 F (38.9 C), not controlled by medication.   You develop shaking chills, confusion, chest pain, or  shortness of breath.  Document Released: 12/14/2001 Document Revised: 12/04/2010 Document Reviewed: 03/22/2008 [facility] Patient Information 2012 [place], [place].  RESOURCE GUIDE  Dental Problems  Patients with Medicaid: [facility] [phone]                                           [phone] [address] Phone:  [phone]                                                  Phone:  [phone]  If unable to pay or uninsured, contact:  [facility] or [facility] to become qualified for the adult dental clinic.  Chronic Pain Problems Contact [facility]  [phone] Patients need to be referred by their primary care doctor.  Insufficient Money for Medicine Contact [employer]:  call "211" or [facility] [phone].  No Primary Care Doctor Call [facility]  [phone] Other agencies that provide inexpensive medical care    [facility]  [phone]    [facility]  [phone]    [facility]  [phone]    [facility]  [phone]    [facility]  [phone]    [facility]  [phone]  Psychological Services [facility]  [phone] [facility]  [phone] [facility]   [phone] (emergency services [phone])  Substance Abuse Resources [facility]  [phone] [facility] [phone] [facility] [place] [phone] Floyd[facility] Flock [phone] Residential & Outpatient Substance Abuse Program  [phone]  Abuse/Neglect [facility] Child Abuse Hotline 808-373-4441 [facility] Child Abuse Hotline 3195454959 (After Hours)  Emergency Shelter [facility] [phone]  Maternity Homes [facility] [phone] [facility] [phone]  MRSA Hotline #:   (220)247-8312    [facility] Resources  [facility]     [employer]                          [facility] Dept. [address] [place]                       [address]      [address]  [place]                                                Cristobal Goldmann Phone:  [phone]                                   Phone:  [phone]  Phone:  [phone]  [facility] Phone:  [phone]  [facility] Child Abuse Hotline 514-500-1620 [phone] (After Hours)

## 2011-06-10 ENCOUNTER — Encounter: Payer: Self-pay | Admitting: Cardiovascular Disease

## 2011-06-10 ENCOUNTER — Ambulatory Visit (INDEPENDENT_AMBULATORY_CARE_PROVIDER_SITE_OTHER): Payer: Medicare Other | Admitting: Cardiovascular Disease

## 2011-06-10 DIAGNOSIS — I2510 Atherosclerotic heart disease of native coronary artery without angina pectoris: Secondary | ICD-10-CM

## 2011-06-10 DIAGNOSIS — E7800 Pure hypercholesterolemia, unspecified: Secondary | ICD-10-CM

## 2011-06-10 DIAGNOSIS — I6529 Occlusion and stenosis of unspecified carotid artery: Secondary | ICD-10-CM

## 2011-06-10 DIAGNOSIS — R42 Dizziness and giddiness: Secondary | ICD-10-CM

## 2011-06-10 DIAGNOSIS — I10 Essential (primary) hypertension: Secondary | ICD-10-CM

## 2011-06-10 DIAGNOSIS — Z951 Presence of aortocoronary bypass graft: Secondary | ICD-10-CM

## 2011-06-10 NOTE — Assessment & Plan Note (Signed)
Cholesterol is at goal on the current lipid regimen. No changes to the medications were made.  

## 2011-06-10 NOTE — Patient Instructions (Signed)
You are doing well. No medication changes were made.  Please call us if you have new issues that need to be addressed before your next appt.  Your physician wants you to follow-up in: 6 months.  You will receive a reminder letter in the mail two months in advance. If you don't receive a letter, please call our office to schedule the follow-up appointment.   

## 2011-06-10 NOTE — Assessment & Plan Note (Signed)
Etiology of his dizzy episodes is uncertain. This seems to be a chronic issue though concerning for orthostasis, possibly arrhythmia. We have suggested if his symptoms get worse, that he call our office. Options would include a Holter monitor or 30 day event monitor.

## 2011-06-10 NOTE — Assessment & Plan Note (Signed)
60-70% bilateral carotid arterial disease. Continue aggressive cholesterol management. Repeat carotid in 6 months.

## 2011-06-10 NOTE — Assessment & Plan Note (Signed)
Currently with no symptoms of angina. No further workup at this time. Continue current medication regimen. 

## 2011-06-10 NOTE — Progress Notes (Signed)
Patient ID: ELAM ELLIS, male    DOB: January 07, 1932, 78 y.o.   MRN: [id number]  HPI Comments: Mr. Houseworth is a very pleasant 78 year old male with history of coronary artery disease, status post bypass surgery in 2009.  He has a normal ejection fraction.  PMH also notable for HTN, HL, carotid stenosis and COPD. Returns for routine f/u.  Carotid u/s showing R 60-79% L 40-59% stable  He reports that he has had a challenging year. Several months ago, he had a series of episodes of chest pain and epigastric pain. After several admissions to the emergency room, he finally was found to have gallstones. He had a lap chole. Several weeks later, had dizziness and profound anemia, vomiting blood found to have an ulcer with hematocrit of 22. He required transfusion of several units.   Since that time, he has had rare episodes of dizziness and orthostasis. This seems to be a chronic pain. He has chronic neck pain. He reports that he is very active, is able to "go for 24 hours solid" with work and had no shortness of breath or chest pain.    Admitted to [facility] in February 2012 with [place]. Underwen cath which Showed LAD T LCX OM-2 long 80% RCA totalled proximally. SVG-PDA was patent. LIMA patent. SVG-OM chronically occluded. Underwent PCI with Xience DES to OM-2. Admitted last week to [facility] with CP after eating an onion. ECG and CE normal.     Recent carotid ultrasound shows 60-79% on the right, 60% on the left EKG shows normal sinus rhythm with rate 59 beats per minute with no significant ST or T wave changes    Outpatient Encounter Prescriptions as of 06/10/2011  Medication Sig Dispense Refill  . aspirin 81 MG tablet Take 81 mg by mouth daily.       Marland Kitchen atorvastatin (LIPITOR) 40 MG tablet Take 40 mg by mouth daily.        . carvedilol (COREG) 6.25 MG tablet Take 6.25 mg by mouth 2 (two) times daily.       . ferrous sulfate 325 (65 FE) MG tablet Take 1 tablet (325 mg total) by mouth 2 (two) times daily.  60 tablet  3    . lisinopril (PRINIVIL,ZESTRIL) 40 MG tablet Take 40 mg by mouth daily.        . Multiple Vitamins-Minerals (EYE-VITE EXTRA) CAPS Take by mouth.      . pantoprazole (PROTONIX) 40 MG tablet Take 40 mg by mouth daily.         Review of Systems  Constitutional: Negative.   HENT: Negative.   Eyes: Negative.   Respiratory: Negative.   Cardiovascular: Negative.   Gastrointestinal: Negative.   Musculoskeletal: Positive for back pain.       Neck pain  Skin: Negative.   Neurological: Negative.   Hematological: Negative.   Psychiatric/Behavioral: Negative.   All other systems reviewed and are negative.    BP 164/76  Pulse 62  Ht 5' 3.5" (1.613 m)  Wt 143 lb 1.9 oz (64.919 kg)  BMI 24.95 kg/m2  Physical Exam  Nursing note and vitals reviewed. Constitutional: He is oriented to person, place, and time. He appears well-developed and well-nourished.  HENT:  Head: Normocephalic.  Nose: Nose normal.  Mouth/Throat: Oropharynx is clear and moist.  Eyes: Conjunctivae are normal. Pupils are equal, round, and reactive to light.  Neck: Normal range of motion. Neck supple. No JVD present.  Cardiovascular: Normal rate, regular rhythm, S1 normal, S2 normal, normal  heart sounds and intact distal pulses.  Exam reveals no gallop and no friction rub.   No murmur heard. Pulmonary/Chest: Effort normal and breath sounds normal. No respiratory distress. He has no wheezes. He has no rales. He exhibits no tenderness.  Abdominal: Soft. Bowel sounds are normal. He exhibits no distension. There is no tenderness.  Musculoskeletal: Normal range of motion. He exhibits no edema and no tenderness.  Lymphadenopathy:    He has no cervical adenopathy.  Neurological: He is alert and oriented to person, place, and time. Coordination normal.  Skin: Skin is warm and dry. No rash noted. No erythema.  Psychiatric: He has a normal mood and affect. His behavior is normal. Judgment and thought content normal.            Assessment and Plan

## 2011-06-10 NOTE — Assessment & Plan Note (Signed)
Blood pressure is elevated today. He reports it is typically well-controlled. He asked him to monitor his blood pressure for [place] and call us for systolic pressures greater than 140

## 2011-06-12 ENCOUNTER — Other Ambulatory Visit: Payer: Self-pay | Admitting: Family Medicine

## 2011-06-12 ENCOUNTER — Other Ambulatory Visit (HOSPITAL_COMMUNITY): Payer: Self-pay | Admitting: Internal Medicine

## 2011-07-27 ENCOUNTER — Other Ambulatory Visit: Payer: Self-pay | Admitting: Family Medicine

## 2011-08-27 ENCOUNTER — Other Ambulatory Visit: Payer: Self-pay | Admitting: *Deleted

## 2011-08-27 MED ORDER — CARVEDILOL 6.25 MG PO TABS: 6.2500 mg | ORAL_TABLET | Freq: Two times a day (BID) | ORAL | Status: DC

## 2011-08-27 NOTE — Telephone Encounter (Signed)
Fax Received. Refill Completed. Pacer Dorn Chowoe (R.M.A)   

## 2011-09-22 ENCOUNTER — Other Ambulatory Visit (HOSPITAL_COMMUNITY): Payer: Self-pay

## 2011-09-22 MED ORDER — CARVEDILOL 6.25 MG PO TABS: 6.2500 mg | ORAL_TABLET | Freq: Two times a day (BID) | ORAL | Status: DC

## 2011-09-22 NOTE — Telephone Encounter (Signed)
..   Requested Prescriptions   Signed Prescriptions Disp Refills  . carvedilol (COREG) 6.25 MG tablet 60 tablet 5    Sig: Take 1 tablet (6.25 mg total) by mouth 2 (two) times daily.    Authorizing Provider: Dolores Patty    Ordering User: Christella Hartigan, Akirra Lacerda Judie Petit

## 2011-09-23 ENCOUNTER — Other Ambulatory Visit: Payer: Self-pay | Admitting: *Deleted

## 2011-09-23 MED ORDER — CARVEDILOL 6.25 MG PO TABS: 6.2500 mg | ORAL_TABLET | Freq: Two times a day (BID) | ORAL | Status: DC

## 2011-09-23 MED ORDER — PANTOPRAZOLE SODIUM 40 MG PO TBEC: 40.0000 mg | DELAYED_RELEASE_TABLET | Freq: Every day | ORAL | Status: DC

## 2011-10-07 ENCOUNTER — Other Ambulatory Visit: Payer: Self-pay | Admitting: Family Medicine

## 2011-12-05 ENCOUNTER — Other Ambulatory Visit: Payer: Self-pay | Admitting: Cardiology

## 2011-12-05 DIAGNOSIS — I6529 Occlusion and stenosis of unspecified carotid artery: Secondary | ICD-10-CM

## 2011-12-11 ENCOUNTER — Encounter: Payer: Self-pay | Admitting: Cardiovascular Disease

## 2011-12-11 ENCOUNTER — Ambulatory Visit (INDEPENDENT_AMBULATORY_CARE_PROVIDER_SITE_OTHER): Payer: Medicare Other | Admitting: Cardiovascular Disease

## 2011-12-11 ENCOUNTER — Encounter (INDEPENDENT_AMBULATORY_CARE_PROVIDER_SITE_OTHER): Payer: Medicare Other

## 2011-12-11 VITALS — BP 154/72 | HR 55 | Ht 63.0 in | Wt 143.5 lb

## 2011-12-11 DIAGNOSIS — E785 Hyperlipidemia, unspecified: Secondary | ICD-10-CM

## 2011-12-11 DIAGNOSIS — I10 Essential (primary) hypertension: Secondary | ICD-10-CM

## 2011-12-11 DIAGNOSIS — I2510 Atherosclerotic heart disease of native coronary artery without angina pectoris: Secondary | ICD-10-CM

## 2011-12-11 DIAGNOSIS — J449 Chronic obstructive pulmonary disease, unspecified: Secondary | ICD-10-CM

## 2011-12-11 DIAGNOSIS — I6529 Occlusion and stenosis of unspecified carotid artery: Secondary | ICD-10-CM

## 2011-12-11 DIAGNOSIS — I25810 Atherosclerosis of coronary artery bypass graft(s) without angina pectoris: Secondary | ICD-10-CM

## 2011-12-11 DIAGNOSIS — E7800 Pure hypercholesterolemia, unspecified: Secondary | ICD-10-CM

## 2011-12-11 DIAGNOSIS — R42 Dizziness and giddiness: Secondary | ICD-10-CM

## 2011-12-11 DIAGNOSIS — R0989 Other specified symptoms and signs involving the circulatory and respiratory systems: Secondary | ICD-10-CM

## 2011-12-11 NOTE — Patient Instructions (Signed)
You are doing well. No medication changes were made.  Please call us if you have new issues that need to be addressed before your next appt.  Your physician wants you to follow-up in: 6 months.  You will receive a reminder letter in the mail two months in advance. If you don't receive a letter, please call our office to schedule the follow-up appointment.   

## 2011-12-11 NOTE — Assessment & Plan Note (Signed)
Currently with no symptoms of angina. No further workup at this time. Continue current medication regimen. 

## 2011-12-11 NOTE — Assessment & Plan Note (Signed)
Continue Lipitor. Repeat cholesterol panel.

## 2011-12-11 NOTE — Assessment & Plan Note (Signed)
No significant shortness of breath. Prior smoking history.

## 2011-12-11 NOTE — Progress Notes (Signed)
Patient ID: Christian [name], male    DOB: [birth year], 78 y.o.   MRN: [id number]  HPI Comments: Christian [name] is a very pleasant 78 year old male with history of coronary artery disease, status post bypass surgery in 2009.  He has a normal ejection fraction.  PMH also notable for HTN, HL, carotid stenosis and COPD. Returns for routine f/u.  Carotid u/s showing R 60-79% L 40-59% stable  Numerous medical issues in 2012 including gallstones with chronic pain requiring lap chole. Several weeks later, had dizziness and profound anemia, vomiting blood found to have an ulcer with hematocrit of 22. He required transfusion of several units.  Since that time, he has had rare episodes of dizziness and orthostasis. This seems to be a chronic pain. He has chronic neck pain. He reports that he is very active, is able to "go for 24 hours solid" with work and had no shortness of breath or chest pain.    Admitted to [facility] in February 2012 with [place]. Underwen cath which Showed LAD T LCX OM-2 long 80% RCA totalled proximally. SVG-PDA was patent. LIMA patent. SVG-OM chronically occluded. Underwent PCI with Xience DES to OM-2. Admitted last week to [facility] with CP after eating an onion. ECG and CE normal.    Today he reports that he feels well with no complaints. He is active, has been cutting wood. No significant shortness of breath. He presents with his girlfriend. He denies any limitations or recent medical issues in 2013. He is scheduled for carotid ultrasound later this afternoon.    Previous carotid ultrasound shows 60-79% on the right, 60% on the left EKG shows normal sinus rhythm with rate 55 beats per minute with no significant ST or T wave changes    Outpatient Encounter Prescriptions as of 12/11/2011  Medication Sig Dispense Refill  . aspirin 81 MG tablet Take 81 mg by mouth daily.       Marland Kitchen atorvastatin (LIPITOR) 80 MG tablet Take 80 mg by mouth daily.      . carvedilol (COREG) 6.25 MG tablet Take 1 tablet (6.25 mg  total) by mouth 2 (two) times daily.  180 tablet  3  . lisinopril (PRINIVIL,ZESTRIL) 40 MG tablet Take 40 mg by mouth daily.          Review of Systems  Constitutional: Negative.   HENT: Negative.   Eyes: Negative.   Respiratory: Negative.   Cardiovascular: Negative.   Gastrointestinal: Negative.   Musculoskeletal: Positive for back pain.       Neck pain  Skin: Negative.   Neurological: Negative.   Hematological: Negative.   Psychiatric/Behavioral: Negative.   All other systems reviewed and are negative.    BP 154/72  Pulse 55  Ht 5\' 3"  (1.6 m)  Wt 143 lb 8 oz (65.091 kg)  BMI 25.42 kg/m2  Physical Exam  Nursing note and vitals reviewed. Constitutional: He is oriented to person, place, and time. He appears well-developed and well-nourished.  HENT:  Head: Normocephalic.  Nose: Nose normal.  Mouth/Throat: Oropharynx is clear and moist.  Eyes: Conjunctivae are normal. Pupils are equal, round, and reactive to light.  Neck: Normal range of motion. Neck supple. No JVD present.  Cardiovascular: Normal rate, regular rhythm, S1 normal, S2 normal, normal heart sounds and intact distal pulses.  Exam reveals no gallop and no friction rub.   No murmur heard. Pulmonary/Chest: Effort normal and breath sounds normal. No respiratory distress. He has no wheezes. He has no rales. He exhibits no  tenderness.  Abdominal: Soft. Bowel sounds are normal. He exhibits no distension. There is no tenderness.  Musculoskeletal: Normal range of motion. He exhibits no edema and no tenderness.  Lymphadenopathy:    He has no cervical adenopathy.  Neurological: He is alert and oriented to person, place, and time. Coordination normal.  Skin: Skin is warm and dry. No rash noted. No erythema.  Psychiatric: He has a normal mood and affect. His behavior is normal. Judgment and thought content normal.           Assessment and Plan

## 2011-12-11 NOTE — Assessment & Plan Note (Signed)
Repeat carotid ultrasound this afternoon. Goal LDL less than 70.

## 2011-12-11 NOTE — Assessment & Plan Note (Signed)
Symptoms have resolved. Etiology uncertain.

## 2012-02-16 ENCOUNTER — Encounter: Payer: Self-pay | Admitting: Cardiovascular Disease

## 2012-02-16 ENCOUNTER — Ambulatory Visit (INDEPENDENT_AMBULATORY_CARE_PROVIDER_SITE_OTHER): Payer: Medicare Other | Admitting: Cardiovascular Disease

## 2012-02-16 VITALS — BP 120/70 | HR 57 | Ht 63.5 in | Wt 142.0 lb

## 2012-02-16 DIAGNOSIS — J069 Acute upper respiratory infection, unspecified: Secondary | ICD-10-CM | POA: Insufficient documentation

## 2012-02-16 DIAGNOSIS — E7800 Pure hypercholesterolemia, unspecified: Secondary | ICD-10-CM

## 2012-02-16 DIAGNOSIS — Z951 Presence of aortocoronary bypass graft: Secondary | ICD-10-CM

## 2012-02-16 DIAGNOSIS — I6529 Occlusion and stenosis of unspecified carotid artery: Secondary | ICD-10-CM

## 2012-02-16 DIAGNOSIS — R0602 Shortness of breath: Secondary | ICD-10-CM

## 2012-02-16 MED ORDER — ALBUTEROL SULFATE HFA 108 (90 BASE) MCG/ACT IN AERS: 2.0000 | INHALATION_SPRAY | Freq: Four times a day (QID) | RESPIRATORY_TRACT | Status: DC | PRN

## 2012-02-16 MED ORDER — GUAIFENESIN-CODEINE 100-10 MG/5ML PO SYRP: 5.0000 mL | ORAL_SOLUTION | Freq: Three times a day (TID) | ORAL | Status: DC | PRN

## 2012-02-16 NOTE — Assessment & Plan Note (Signed)
Cholesterol is at goal on the current lipid regimen. No changes to the medications were made.  

## 2012-02-16 NOTE — Patient Instructions (Addendum)
  Start robitussin with codeine at night for cough Continue to use inhaler for shortness of breath  Please call us if you have new issues that need to be addressed before your next appt.  Your physician wants you to follow-up in: 6 months.  You will receive a reminder letter in the mail two months in advance. If you don't receive a letter, please call our office to schedule the follow-up appointment.

## 2012-02-16 NOTE — Assessment & Plan Note (Signed)
Continue aggressive cholesterol management, annual carotid ultrasound

## 2012-02-16 NOTE — Assessment & Plan Note (Signed)
Currently with no symptoms of angina. No further workup at this time. Continue current medication regimen. He does report having some shortness of breath. We have asked him to contact our office after his upper respiratory infection has resolved and her shortness of breath persists, we would do a  treadmill/stress Myoview

## 2012-02-16 NOTE — Progress Notes (Signed)
Patient ID: Christian [name], male    DOB: January 10, 1932, 79 y.o.   MRN: [id number]  HPI Comments: Christian [name] is a very pleasant 78 year old male with history of coronary artery disease, status post bypass surgery in 2009.  He has a normal ejection fraction.  PMH also notable for HTN, HL, carotid stenosis and COPD. Returns for routine f/u.  Carotid u/s showing R 60-79% L 40-59% stable   in 2012, gallstones with chronic pain requiring lap chole.  Previous dizziness and profound anemia in 2012, vomiting blood found to have an ulcer with hematocrit of 22. He required transfusion of several units.  chronic neck pain. He reports that he is very active, is able to "go for 24 hours solid" with work and had no shortness of breath or chest pain.    Admitted to [facility] in February 2012,  Underwent cath which Showed LAD T LCX OM-2 long 80% RCA totalled proximally. SVG-PDA was patent. LIMA patent. SVG-OM chronically occluded. Underwent PCI with Xience DES to OM-2. Admitted last week to [facility] with CP after eating an onion. ECG and CE normal.    He reports having significant upper respiratory infection over the past 3 weeks. Started after a flu shot. He has had cough, laryngitis, sputum. He has tried Robitussin. He has had back pain for the past 2 weeks and rib pain from heavy coughing. Back and rib pain has slowly improved and he has 28-year-old albuterol which does not seem to be working. He thinks he is slowly getting better. Sleep has been poor.     Previous carotid ultrasound shows 60-79% on the right, 60% on the left EKG shows normal sinus rhythm with rate 57 beats per minute with no significant ST or T wave changes    Outpatient Encounter Prescriptions as of 02/16/2012  Medication Sig Dispense Refill  . albuterol (PROAIR HFA) 108 (90 BASE) MCG/ACT inhaler Inhale 2 puffs into the lungs every 6 (six) hours as needed.  1 Inhaler  6  . aspirin 81 MG tablet Take 81 mg by mouth daily.       Marland Kitchen atorvastatin (LIPITOR) 40  MG tablet Take 40 mg by mouth daily.      . carvedilol (COREG) 6.25 MG tablet Take 1 tablet (6.25 mg total) by mouth 2 (two) times daily.  180 tablet  3  . lisinopril (PRINIVIL,ZESTRIL) 40 MG tablet Take 40 mg by mouth daily.        . pantoprazole (PROTONIX) 40 MG tablet Take 40 mg by mouth daily.       Marland Kitchen guaiFENesin-codeine (ROBITUSSIN AC) 100-10 MG/5ML syrup Take 5 mLs by mouth 3 (three) times daily as needed for cough.  120 mL  1    Review of Systems  Constitutional: Positive for fatigue.  HENT: Negative.   Eyes: Negative.   Respiratory: Positive for cough and wheezing.   Cardiovascular: Negative.   Gastrointestinal: Negative.   Musculoskeletal: Positive for back pain.  Skin: Negative.   Neurological: Negative.   Hematological: Negative.   Psychiatric/Behavioral: Negative.   All other systems reviewed and are negative.    BP 120/70  Pulse 57  Ht 5' 3.5" (1.613 m)  Wt 142 lb (64.411 kg)  BMI 24.76 kg/m2  Physical Exam  Nursing note and vitals reviewed. Constitutional: He is oriented to person, place, and time. He appears well-developed and well-nourished.  HENT:  Head: Normocephalic.  Nose: Nose normal.  Mouth/Throat: Oropharynx is clear and moist.  Eyes: Conjunctivae normal are normal. Pupils  are equal, round, and reactive to light.  Neck: Normal range of motion. Neck supple. No JVD present.  Cardiovascular: Normal rate, regular rhythm, S1 normal, S2 normal, normal heart sounds and intact distal pulses.  Exam reveals no gallop and no friction rub.   No murmur heard. Pulmonary/Chest: Effort normal and breath sounds normal. No respiratory distress. He has no wheezes. He has no rales. He exhibits no tenderness.  Abdominal: Soft. Bowel sounds are normal. He exhibits no distension. There is no tenderness.  Musculoskeletal: Normal range of motion. He exhibits no edema and no tenderness.  Lymphadenopathy:    He has no cervical adenopathy.  Neurological: He is alert and  oriented to person, place, and time. Coordination normal.  Skin: Skin is warm and dry. No rash noted. No erythema.  Psychiatric: He has a normal mood and affect. His behavior is normal. Judgment and thought content normal.           Assessment and Plan

## 2012-02-16 NOTE — Assessment & Plan Note (Signed)
I suspect he has a resolving upper respiratory infection, viral. I suggested if symptoms do not get better, that he call our office for primary care for antibiotics. As sleeping coughing is his main concern, we have prescribed Robitussin with codeine. We will also renew his albuterol as this is 78 years old.

## 2012-03-29 ENCOUNTER — Encounter: Payer: Self-pay | Admitting: Family Medicine

## 2012-03-29 ENCOUNTER — Ambulatory Visit (INDEPENDENT_AMBULATORY_CARE_PROVIDER_SITE_OTHER): Payer: Medicare Other | Admitting: Family Medicine

## 2012-03-29 VITALS — BP 130/70 | HR 58 | Temp 98.0°F | Ht 63.5 in | Wt 146.2 lb

## 2012-03-29 DIAGNOSIS — H903 Sensorineural hearing loss, bilateral: Secondary | ICD-10-CM | POA: Insufficient documentation

## 2012-03-29 DIAGNOSIS — W9421XA Exposure to reduction in atmospheric pressure while surfacing from deep-water diving, initial encounter: Secondary | ICD-10-CM

## 2012-03-29 DIAGNOSIS — T7029XA Other effects of high altitude, initial encounter: Secondary | ICD-10-CM

## 2012-03-29 DIAGNOSIS — T7020XA Unspecified effects of high altitude, initial encounter: Secondary | ICD-10-CM

## 2012-03-29 DIAGNOSIS — H9190 Unspecified hearing loss, unspecified ear: Secondary | ICD-10-CM
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
## 2012-03-29 NOTE — Patient Instructions (Addendum)
Stop at front desk to set up ENT referral.  Schedule medicare wellness in next few months with fasting labs prior.

## 2012-04-06 IMAGING — CT CT CERVICAL SPINE W/O CM
3 of 6 series · 8 of 20 positions shown, 9 images · non-contrast
Comparison: Cervical radiographs 01/25/2010.  Head CT 02/10/2007.

CT HEAD

CLINICAL DATA: 79-year-old male with headache, dizziness, neck
pain.

CT HEAD WITHOUT CONTRAST
CT CERVICAL SPINE WITHOUT CONTRAST
TECHNIQUE: Multidetector CT imaging of the head and cervical spine
was performed following the standard protocol without intravenous
contrast.  Multiplanar CT image reconstructions of the cervical
spine were also generated.

[Series 5: recon 2: c-spine · axial · 0.26mm/px · z∈[-242,-190]mm · 2 of 64 slices shown]
[im 22/64  bone]
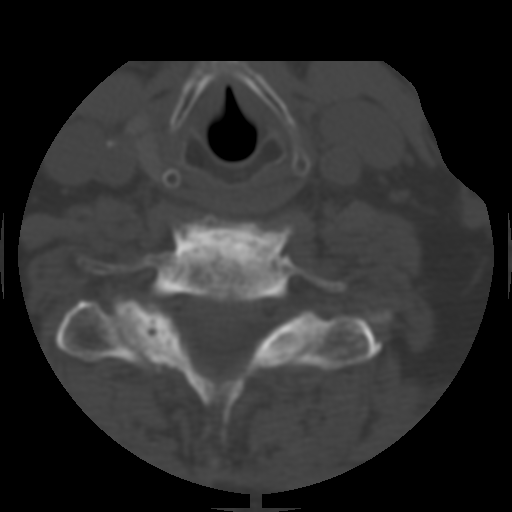
[im 43/64  bone]
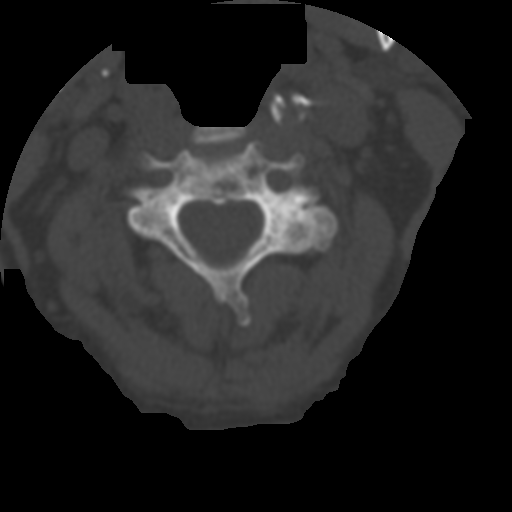

[Series 600: axial · axial · 0.33mm/px · z∈[-329,-135]mm · 3 of 60 slices shown, 4 images]
[im 1/60  soft-tissue]
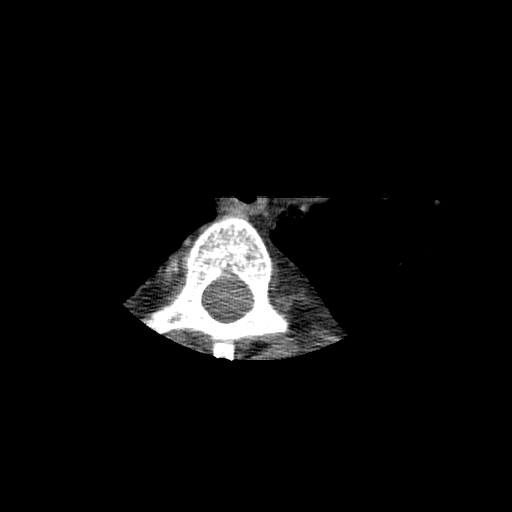
[im 1/60  bone]
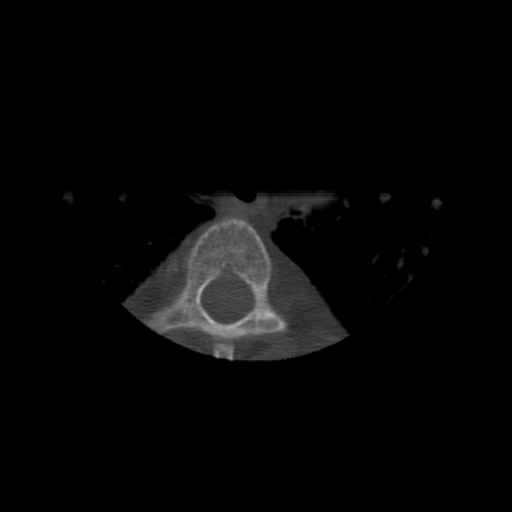
[im 30/60  bone]
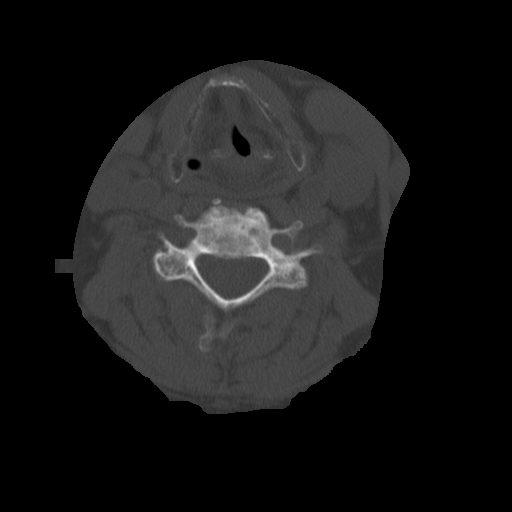
[im 60/60  bone]
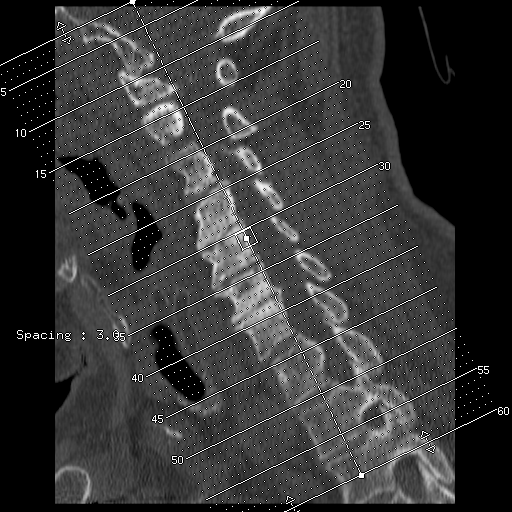

[Series 601: cor · coronal · 0.33mm/px · 3 of 38 slices shown]
[im 8/38  bone]
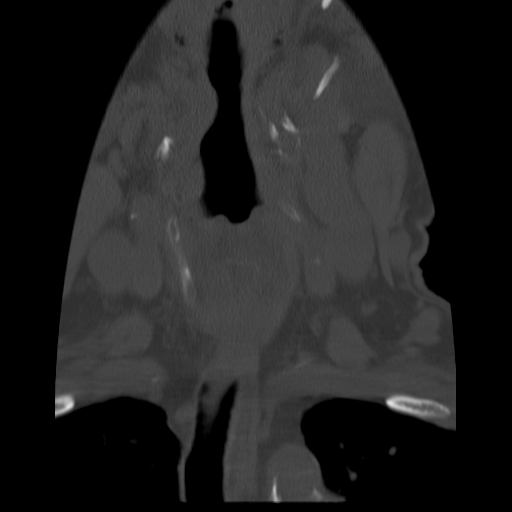
[im 15/38  bone]
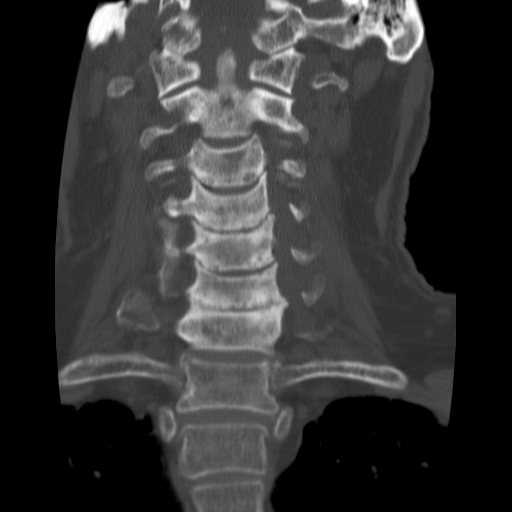
[im 23/38  bone]
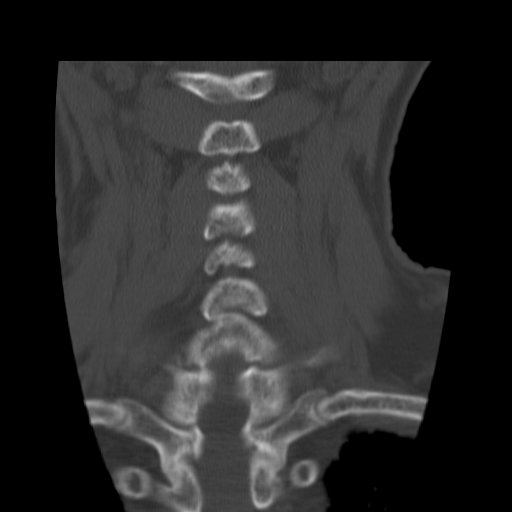

[8 of 20 positions shown; findings below may reference images not displayed]

FINDINGS: Chronic paranasal sinus mucoperiosteal thickening.
Mastoids are clear. No acute osseous abnormality identified.
Visualized orbits and scalp soft tissues are within normal limits.

Calcified atherosclerosis at the skull base.  Some progression of
generalized cerebral volume loss is noted.  No ventriculomegaly. No
midline shift, mass effect, or evidence of mass lesion.  No acute
intracranial hemorrhage identified.  No suspicious intracranial
vascular hyperdensity. No evidence of cortically based acute
infarction identified.
IMPRESSION: 1. No acute intracranial abnormality.  Age congruent cerebral
volume loss with mild progression since [DATE].  Cervical findings are below.

CT CERVICAL SPINE
FINDINGS: Straightening of cervical lordosis.  Trace
anterolisthesis of C2-1 C3 and also C7 on T1.  Degenerative
ligamentous and osseous hypertrophy at the C1-C2 articulation.
Visualized skull base is intact.  No atlanto-occipital
dissociation.  Bilateral posterior element alignment is within
normal limits.  Cervical vertebral sclerosis appears to be
degenerative, with multilevel disc space loss and left greater than
right facet hypertrophy.  No acute cervical fracture.  Grossly
intact visualized upper thoracic levels.  Calcified
atherosclerosis.  This is severe at the right ICA origin.  Other
Visualized paraspinal soft tissues are within normal limits.
Apical pulmonary paraseptal emphysema.
IMPRESSION: 1.  Widespread cervical degenerative changes. No acute fracture or
listhesis identified in the cervical spine.  Ligamentous injury is
not excluded.
2.  Severe right ICA origin calcified atherosclerosis, suspected to
be hemodynamically significant.

## 2012-04-07 DIAGNOSIS — G459 Transient cerebral ischemic attack, unspecified: Secondary | ICD-10-CM

## 2012-04-07 HISTORY — DX: Transient cerebral ischemic attack, unspecified: G45.9

## 2012-04-23 ENCOUNTER — Other Ambulatory Visit: Payer: Self-pay | Admitting: Family Medicine

## 2012-04-23 MED ORDER — PANTOPRAZOLE SODIUM 40 MG PO TBEC: 40.0000 mg | DELAYED_RELEASE_TABLET | Freq: Every day | ORAL | Status: DC

## 2012-04-27 ENCOUNTER — Encounter: Payer: Self-pay | Admitting: *Deleted

## 2012-04-28 NOTE — Assessment & Plan Note (Signed)
Refer to ENT for further evaluation of ear given TM perforation an hearing loss.

## 2012-05-24 ENCOUNTER — Other Ambulatory Visit: Payer: Self-pay | Admitting: *Deleted

## 2012-05-24 ENCOUNTER — Other Ambulatory Visit: Payer: Self-pay

## 2012-05-24 MED ORDER — CARVEDILOL 6.25 MG PO TABS: 6.2500 mg | ORAL_TABLET | Freq: Two times a day (BID) | ORAL | Status: DC

## 2012-05-24 NOTE — Telephone Encounter (Signed)
Pt would like to get year supply when called in. Pt has f/u on 3/11 with [facility]

## 2012-05-24 NOTE — Telephone Encounter (Signed)
Refill sent for carvedilol 6.25 mg take one tablet twice a day.  

## 2012-05-24 NOTE — Telephone Encounter (Signed)
Refilled Carvedilol sent to [facility].

## 2012-06-10 ENCOUNTER — Encounter (INDEPENDENT_AMBULATORY_CARE_PROVIDER_SITE_OTHER): Payer: Medicare Other

## 2012-06-10 ENCOUNTER — Observation Stay: Payer: Self-pay | Admitting: Internal Medicine

## 2012-06-10 DIAGNOSIS — R200 Anesthesia of skin: Secondary | ICD-10-CM

## 2012-06-10 DIAGNOSIS — R4701 Aphasia: Secondary | ICD-10-CM

## 2012-06-10 DIAGNOSIS — R42 Dizziness and giddiness: Secondary | ICD-10-CM

## 2012-06-10 DIAGNOSIS — R531 Weakness: Secondary | ICD-10-CM

## 2012-06-10 DIAGNOSIS — R209 Unspecified disturbances of skin sensation: Secondary | ICD-10-CM

## 2012-06-10 DIAGNOSIS — I6529 Occlusion and stenosis of unspecified carotid artery: Secondary | ICD-10-CM

## 2012-06-10 LAB — COMPREHENSIVE METABOLIC PANEL
Albumin: 3.4 g/dL (ref 3.4–5.0)
Alkaline Phosphatase: 92 U/L (ref 50–136)
Anion Gap: 3 — ABNORMAL LOW (ref 7–16)
BUN: 18 mg/dL (ref 7–18)
Bilirubin,Total: 0.4 mg/dL (ref 0.2–1.0)
Calcium, Total: 8.3 mg/dL — ABNORMAL LOW (ref 8.5–10.1)
Co2: 28 mmol/L (ref 21–32)
EGFR (African American): >60
Glucose: 94 mg/dL (ref 65–99)
Osmolality: 281 (ref 275–301)
SGOT(AST): 24 U/L (ref 15–37)
Sodium: 140 mmol/L (ref 136–145)

## 2012-06-10 LAB — CBC
HCT: 39.6 % — ABNORMAL LOW (ref 40.0–52.0)
HGB: 13.5 g/dL (ref 13.0–18.0)
MCV: 96 fL (ref 80–100)
RBC: 4.12 10*6/uL — ABNORMAL LOW (ref 4.40–5.90)
RDW: 13.6 % (ref 11.5–14.5)

## 2012-06-10 LAB — CK TOTAL AND CKMB (NOT AT ARMC): CK-MB: 1 ng/mL (ref 0.5–3.6)

## 2012-06-10 LAB — TROPONIN I: Troponin-I: <0.02 ng/mL

## 2012-06-10 LAB — PROTIME-INR: Prothrombin Time: 12.8 secs (ref 11.5–14.7)

## 2012-06-10 NOTE — Progress Notes (Signed)
Patient ID: Christian [name], male   DOB: 1931/06/11, 79 y.o.   MRN: [id number] Pt here for carotid u/s as ordered by Dr. Mariah Milling.  Vascular tech came to tell me pt was c/o weakness and lightheadedness. I came in to assess patient immediately BP on Right was 120/60, left arm was 132/80 Pt sitting in chair, alert and oriented x 4 Pt began to tell me how he was grocery shopping 3 days ago and began to feel very weak in legs and lightheaded. Says he had to sit down He then began to "talk jibberish" and was not making any sense This episode lasted only a short while and he did not seek medical attention He tells me he is beginning to feel the same way, lightheaded and weak I helped him to stretcher for carotid u/s to be performed.  As he began to sit on table, he tells me he feels like he needs to lie down, that he is "very tired" and then begins to try to tell me something but becomes aphasic, begins to cry and tries to tell me he cannot think of the words to say, but these words come out as "jibberish", words not making sense.  Pt is very tearful. I immediately talked with Dr. Elease Hashimoto who was here in office.  He advised we call 911 to have pt evaluated in hospital Pt is agreeable to this plan Confusion and aphasia resolved I called 911 They came shortly and transported pt to [facility] ER

## 2012-06-11 DIAGNOSIS — I359 Nonrheumatic aortic valve disorder, unspecified: Secondary | ICD-10-CM

## 2012-06-11 LAB — URINALYSIS, COMPLETE
Bilirubin,UR: NEGATIVE
Blood: NEGATIVE
Glucose,UR: NEGATIVE mg/dL (ref 0–75)
Ketone: NEGATIVE
Nitrite: NEGATIVE
Ph: 6 (ref 4.5–8.0)
Protein: NEGATIVE
RBC,UR: 2 /HPF (ref 0–5)
Specific Gravity: 1.017 (ref 1.003–1.030)
Squamous Epithelial: <1

## 2012-06-15 ENCOUNTER — Ambulatory Visit (INDEPENDENT_AMBULATORY_CARE_PROVIDER_SITE_OTHER): Payer: Medicare Other | Admitting: Cardiovascular Disease

## 2012-06-15 ENCOUNTER — Encounter: Payer: Self-pay | Admitting: Cardiovascular Disease

## 2012-06-15 VITALS — BP 120/60 | HR 63 | Ht 63.5 in | Wt 145.5 lb

## 2012-06-15 DIAGNOSIS — I2510 Atherosclerotic heart disease of native coronary artery without angina pectoris: Secondary | ICD-10-CM

## 2012-06-15 DIAGNOSIS — I6529 Occlusion and stenosis of unspecified carotid artery: Secondary | ICD-10-CM

## 2012-06-15 DIAGNOSIS — R42 Dizziness and giddiness: Secondary | ICD-10-CM

## 2012-06-15 DIAGNOSIS — E7800 Pure hypercholesterolemia, unspecified: Secondary | ICD-10-CM

## 2012-06-15 NOTE — Progress Notes (Signed)
Patient ID: Christian [name], male    DOB: 1931-10-23, 79 y.o.   MRN: [id number]  HPI Comments: Christian [name] is a very pleasant 79 year old male with history of coronary artery disease, status post bypass surgery in 2009.  He has a normal ejection fraction.  PMH also notable for HTN, HL, carotid stenosis and COPD. Returns for routine f/u.  Carotid u/s showing R 60-79% L 59% stable   in 2012, gallstones with chronic pain requiring lap chole.  Previous dizziness and profound anemia in 2012, vomiting blood found to have an ulcer with hematocrit of 22. He required transfusion of several units.  chronic neck pain. He reports that he is very active, is able to "go for 24 hours solid" with work and had no shortness of breath or chest pain.    Admitted to [facility] in February 2012,  Underwent cath which Showed LAD T LCX OM-2 long 80% RCA totalled proximally. SVG-PDA was patent. LIMA patent. SVG-OM chronically occluded. Underwent PCI with Xience DES to OM-2.   Admitted  to [facility] with CP in 2013 after eating an onion. ECG and CE normal.    He was here for carotid ultrasound and reported having dizziness, was tearful, difficulty talking. He was sent to the emergency room after the nurses discussed the case with Dr. Melburn Popper. He was kept overnight for observation. Workup was essentially normal. Echocardiogram showed ejection fraction greater than 55%, EKG and MRI of the head showed small vessel disease, no acute stroke. Carotid ultrasound showed greater than 50% carotid disease on the right.  He is upset with being sent to the hospital and he worries about the hospital they'll.   Previous EKG shows normal sinus rhythm with rate 57 beats per minute with no significant ST or T wave changes   Outpatient Encounter Prescriptions as of 06/15/2012  Medication Sig Dispense Refill  . aspirin 325 MG tablet Take 325 mg by mouth daily.      Marland Kitchen atorvastatin (LIPITOR) 40 MG tablet Take 40 mg by mouth daily.      . carvedilol (COREG)  6.25 MG tablet Take 1 tablet (6.25 mg total) by mouth 2 (two) times daily.  180 tablet  3  . lisinopril (PRINIVIL,ZESTRIL) 40 MG tablet Take 40 mg by mouth daily.        . pantoprazole (PROTONIX) 40 MG tablet Take 1 tablet (40 mg total) by mouth daily.  90 tablet  0   Review of Systems  HENT: Negative.   Eyes: Negative.   Cardiovascular: Negative.   Gastrointestinal: Negative.   Musculoskeletal: Positive for back pain.  Skin: Negative.   Neurological: Negative.   Psychiatric/Behavioral: Negative.   All other systems reviewed and are negative.    BP 120/60  Pulse 63  Ht 5' 3.5" (1.613 m)  Wt 145 lb 8 oz (65.998 kg)  BMI 25.37 kg/m2  Physical Exam  Nursing note and vitals reviewed. Constitutional: He is oriented to person, place, and time. He appears well-developed and well-nourished.  HENT:  Head: Normocephalic.  Nose: Nose normal.  Mouth/Throat: Oropharynx is clear and moist.  Eyes: Conjunctivae are normal. Pupils are equal, round, and reactive to light.  Neck: Normal range of motion. Neck supple. No JVD present.  Cardiovascular: Normal rate, regular rhythm, S1 normal, S2 normal, normal heart sounds and intact distal pulses.  Exam reveals no gallop and no friction rub.   No murmur heard. Pulmonary/Chest: Effort normal and breath sounds normal. No respiratory distress. He has no wheezes. He  has no rales. He exhibits no tenderness.  Abdominal: Soft. Bowel sounds are normal. He exhibits no distension. There is no tenderness.  Musculoskeletal: Normal range of motion. He exhibits no edema and no tenderness.  Lymphadenopathy:    He has no cervical adenopathy.  Neurological: He is alert and oriented to person, place, and time. Coordination normal.  Skin: Skin is warm and dry. No rash noted. No erythema.  Psychiatric: He has a normal mood and affect. His behavior is normal. Judgment and thought content normal.      Assessment and Plan

## 2012-06-15 NOTE — Assessment & Plan Note (Signed)
Periodic episodes of dizziness. He did not want any further workup at this time. Recent CT, MRI, carotid ultrasound.

## 2012-06-15 NOTE — Assessment & Plan Note (Signed)
Currently with no symptoms of angina. No further workup at this time. Continue current medication regimen. 

## 2012-06-15 NOTE — Assessment & Plan Note (Signed)
No dramatic change in recent carotid ultrasound done at [facility]. Continue aggressive cholesterol management

## 2012-06-15 NOTE — Assessment & Plan Note (Signed)
Cholesterol is at goal on the current lipid regimen. No changes to the medications were made.  

## 2012-06-15 NOTE — Patient Instructions (Addendum)
You are doing well. No medication changes were made.  Please call us if you have new issues that need to be addressed before your next appt.  Your physician wants you to follow-up in: 6 months.  You will receive a reminder letter in the mail two months in advance. If you don't receive a letter, please call our office to schedule the follow-up appointment.   

## 2012-08-14 ENCOUNTER — Other Ambulatory Visit: Payer: Self-pay | Admitting: Family Medicine

## 2012-08-18 ENCOUNTER — Ambulatory Visit: Payer: Medicare Other | Admitting: Cardiovascular Disease

## 2012-10-23 ENCOUNTER — Other Ambulatory Visit: Payer: Self-pay | Admitting: Family Medicine

## 2012-12-01 ENCOUNTER — Telehealth: Payer: Self-pay | Admitting: *Deleted

## 2012-12-01 NOTE — Telephone Encounter (Signed)
Called patient to schedule carotid doppler after 12/16/12. Patient doesn't want to r/s at this time.

## 2012-12-16 ENCOUNTER — Ambulatory Visit (INDEPENDENT_AMBULATORY_CARE_PROVIDER_SITE_OTHER): Payer: Medicare Other | Admitting: Cardiovascular Disease

## 2012-12-16 ENCOUNTER — Encounter: Payer: Self-pay | Admitting: Cardiovascular Disease

## 2012-12-16 VITALS — BP 120/58 | HR 66 | Ht 64.0 in | Wt 144.0 lb

## 2012-12-16 DIAGNOSIS — E7800 Pure hypercholesterolemia, unspecified: Secondary | ICD-10-CM

## 2012-12-16 DIAGNOSIS — I2510 Atherosclerotic heart disease of native coronary artery without angina pectoris: Secondary | ICD-10-CM

## 2012-12-16 DIAGNOSIS — I10 Essential (primary) hypertension: Secondary | ICD-10-CM

## 2012-12-16 DIAGNOSIS — I6529 Occlusion and stenosis of unspecified carotid artery: Secondary | ICD-10-CM

## 2012-12-16 NOTE — Assessment & Plan Note (Signed)
Repeat carotid ultrasound next year. Continue aggressive lipid management

## 2012-12-16 NOTE — Patient Instructions (Addendum)
You are doing well. No medication changes were made.  Please call us if you have new issues that need to be addressed before your next appt.  Your physician wants you to follow-up in: 6 months.  You will receive a reminder letter in the mail two months in advance. If you don't receive a letter, please call our office to schedule the follow-up appointment.   

## 2012-12-16 NOTE — Assessment & Plan Note (Signed)
Cholesterol is at goal on the current lipid regimen. No changes to the medications were made.  

## 2012-12-16 NOTE — Progress Notes (Signed)
Patient ID: Christian [name], male    DOB: 02-19-1932, 79 y.o.   MRN: [id number]  HPI Comments: Christian [name] is a very pleasant 79 year old male with history of coronary artery disease, status post bypass surgery in 2009.  He has a normal ejection fraction.  PMH also notable for HTN, HL, carotid stenosis and COPD. Returns for routine f/u. He stopped smoking 16 years ago  Carotid u/s showing R 60-79% L 59% stable  In followup today, she is very active, works out for days per week with aerobic and lifting weights. No symptoms of chest pain or shortness of breath   in 2012, gallstones with chronic pain requiring lap chole.  Previous dizziness and profound anemia in 2012, vomiting blood found to have an ulcer with hematocrit of 22. He required transfusion of several units.  chronic neck pain. He reports that he is very active, is able to "go for 24 hours solid" with work and had no shortness of breath or chest pain.    Admitted to [facility] in February 2012,  Underwent cath which Showed LAD T LCX OM-2 long 80% RCA totalled proximally. SVG-PDA was patent. LIMA patent. SVG-OM chronically occluded. Underwent PCI with Xience DES to OM-2.   Admitted  to [facility] with CP in 2013 after eating an onion. ECG and CE normal.    On a previous office visit, he was having a carotid ultrasound, head dizziness and was sent to the emergency room.  He was kept overnight for observation. Workup was essentially normal. Echocardiogram showed ejection fraction greater than 55%, EKG and MRI of the head showed small vessel disease, no acute stroke. Carotid ultrasound showed greater than 50% carotid disease on the right.   Total cholesterol 136   EKG shows normal sinus rhythm with rate 66 beats per minute with no significant ST or T wave changes   Outpatient Encounter Prescriptions as of 12/16/2012  Medication Sig Dispense Refill  . aspirin 325 MG tablet Take 325 mg by mouth daily.      Marland Kitchen atorvastatin (LIPITOR) 40 MG tablet Take 40  mg by mouth daily.      Marland Kitchen atorvastatin (LIPITOR) 40 MG tablet TAKE 1 TABLET BY MOUTH EVERY DAY  30 tablet  0  . carvedilol (COREG) 6.25 MG tablet Take 1 tablet (6.25 mg total) by mouth 2 (two) times daily.  180 tablet  3  . lisinopril (PRINIVIL,ZESTRIL) 40 MG tablet Take 40 mg by mouth daily.        Marland Kitchen lisinopril (PRINIVIL,ZESTRIL) 40 MG tablet TAKE 1 TABLET BY MOUTH EVERY DAY  30 tablet  0  . pantoprazole (PROTONIX) 40 MG tablet TAKE 1 TABLET BY MOUTH DAILY  90 tablet  0   No facility-administered encounter medications on file as of 12/16/2012.    Review of Systems  Constitutional: Negative.   HENT: Negative.   Eyes: Negative.   Respiratory: Negative.   Cardiovascular: Negative.   Gastrointestinal: Negative.   Endocrine: Negative.   Musculoskeletal: Positive for back pain.  Skin: Negative.   Allergic/Immunologic: Negative.   Neurological: Negative.   Hematological: Negative.   Psychiatric/Behavioral: Negative.   All other systems reviewed and are negative.    He is 5 foot 4, 144 pounds, blood pressure 120/58, heart rate 66  Physical Exam  Nursing note and vitals reviewed. Constitutional: He is oriented to person, place, and time. He appears well-developed and well-nourished.  HENT:  Head: Normocephalic.  Nose: Nose normal.  Mouth/Throat: Oropharynx is clear and moist.  Eyes: Conjunctivae are normal. Pupils are equal, round, and reactive to light.  Neck: Normal range of motion. Neck supple. No JVD present.  Cardiovascular: Normal rate, regular rhythm, S1 normal, S2 normal, normal heart sounds and intact distal pulses.  Exam reveals no gallop and no friction rub.   No murmur heard. Pulmonary/Chest: Effort normal and breath sounds normal. No respiratory distress. He has no wheezes. He has no rales. He exhibits no tenderness.  Abdominal: Soft. Bowel sounds are normal. He exhibits no distension. There is no tenderness.  Musculoskeletal: Normal range of motion. He exhibits no  edema and no tenderness.  Lymphadenopathy:    He has no cervical adenopathy.  Neurological: He is alert and oriented to person, place, and time. Coordination normal.  Skin: Skin is warm and dry. No rash noted. No erythema.  Psychiatric: He has a normal mood and affect. His behavior is normal. Judgment and thought content normal.      Assessment and Plan

## 2012-12-16 NOTE — Assessment & Plan Note (Signed)
Currently with no symptoms of angina. No further workup at this time. Continue current medication regimen. 

## 2012-12-16 NOTE — Assessment & Plan Note (Signed)
Blood pressure is well controlled on today's visit. No changes made to the medications. 

## 2012-12-17 ENCOUNTER — Encounter: Payer: Self-pay | Admitting: *Deleted

## 2012-12-30 ENCOUNTER — Telehealth: Payer: Self-pay | Admitting: Family Medicine

## 2012-12-30 NOTE — Telephone Encounter (Signed)
Pt in years overdue for CPX and labs.  He has been seen last in 03/2012 for acute visit.

## 2012-12-30 NOTE — Telephone Encounter (Signed)
Routing to [place], Editor, commissioning Lead.  She will call and explain the importance of office visits to get meds refilled.

## 2012-12-30 NOTE — Telephone Encounter (Signed)
Appt scheduled for tomorrow for med refills.  Although pt did not agree with reasoning, he agreed to come in for visit.

## 2012-12-30 NOTE — Telephone Encounter (Signed)
Just an FYI Christian [name] walked in at 11:50 am today and brought his medicine bottles. He said the pharmancy told him that he needed an office visit before he could get anymore meds. He said he was not going to pay for an office visit and he wanted his medicine. I offered him an appt for tomorrow morning and he yelled  out I"M PISSED OFF. I ask him again did he want to come in tomorrow, he said very loudly "NO I WILL JUST DIE and walked out. The people in the lobby were just looking at him.

## 2012-12-30 NOTE — Telephone Encounter (Signed)
Will CC manager adrienne

## 2012-12-31 ENCOUNTER — Ambulatory Visit: Payer: Medicare Other | Admitting: Family Medicine

## 2012-12-31 ENCOUNTER — Other Ambulatory Visit: Payer: Self-pay | Admitting: *Deleted

## 2012-12-31 MED ORDER — LISINOPRIL 40 MG PO TABS: 40.0000 mg | ORAL_TABLET | Freq: Every day | ORAL | Status: DC

## 2012-12-31 MED ORDER — CARVEDILOL 6.25 MG PO TABS: 6.2500 mg | ORAL_TABLET | Freq: Two times a day (BID) | ORAL | Status: DC

## 2012-12-31 MED ORDER — ATORVASTATIN CALCIUM 40 MG PO TABS: 40.0000 mg | ORAL_TABLET | Freq: Every day | ORAL | Status: DC

## 2012-12-31 NOTE — Telephone Encounter (Signed)
CPX scheduled 02/04/2013 @ 11:15am.  Ok to refill medication until that appointment per Dr. Ermalene Searing.

## 2013-01-28 ENCOUNTER — Telehealth: Payer: Self-pay | Admitting: Family Medicine

## 2013-01-28 ENCOUNTER — Other Ambulatory Visit (INDEPENDENT_AMBULATORY_CARE_PROVIDER_SITE_OTHER): Payer: Medicare Other

## 2013-01-28 DIAGNOSIS — E7800 Pure hypercholesterolemia, unspecified: Secondary | ICD-10-CM

## 2013-01-28 DIAGNOSIS — I10 Essential (primary) hypertension: Secondary | ICD-10-CM

## 2013-01-28 LAB — LIPID PANEL
Cholesterol: 161 mg/dL (ref 0–200)
HDL: 40.8 mg/dL (ref >39.00)
Total CHOL/HDL Ratio: 4
Triglycerides: 109 mg/dL (ref 0.0–149.0)
VLDL: 21.8 mg/dL (ref 0.0–40.0)

## 2013-01-28 LAB — COMPREHENSIVE METABOLIC PANEL
ALT: 18 U/L (ref 0–53)
AST: 17 U/L (ref 0–37)
BUN: 16 mg/dL (ref 6–23)
CO2: 28 mEq/L (ref 19–32)
Calcium: 9.1 mg/dL (ref 8.4–10.5)
Chloride: 107 mEq/L (ref 96–112)
Creatinine, Ser: 1.1 mg/dL (ref 0.4–1.5)
GFR: 65.77 mL/min (ref >60.00)
Glucose, Bld: 101 mg/dL — ABNORMAL HIGH (ref 70–99)

## 2013-01-28 NOTE — Telephone Encounter (Signed)
Message copied by Excell Seltzer on Fri Jan 28, 2013  8:25 AM ------      Message from: Alvina Chou      Created: Fri Jan 14, 2013  4:38 PM      Regarding: Lab orders for Friday, 10.24.14       Patient is scheduled for CPX labs, please order future labs, Thanks , Terri       ------

## 2013-02-04 ENCOUNTER — Encounter: Payer: Self-pay | Admitting: Family Medicine

## 2013-02-04 ENCOUNTER — Ambulatory Visit (INDEPENDENT_AMBULATORY_CARE_PROVIDER_SITE_OTHER): Payer: Medicare Other | Admitting: Family Medicine

## 2013-02-04 VITALS — BP 130/58 | HR 54 | Temp 97.6°F | Ht 62.0 in | Wt 141.8 lb

## 2013-02-04 DIAGNOSIS — G8929 Other chronic pain: Secondary | ICD-10-CM

## 2013-02-04 DIAGNOSIS — E7800 Pure hypercholesterolemia, unspecified: Secondary | ICD-10-CM

## 2013-02-04 DIAGNOSIS — Z1212 Encounter for screening for malignant neoplasm of rectum: Secondary | ICD-10-CM

## 2013-02-04 DIAGNOSIS — M542 Cervicalgia: Secondary | ICD-10-CM

## 2013-02-04 DIAGNOSIS — Z0000 Encounter for general adult medical examination without abnormal findings: Secondary | ICD-10-CM

## 2013-02-04 DIAGNOSIS — H903 Sensorineural hearing loss, bilateral: Secondary | ICD-10-CM

## 2013-02-04 DIAGNOSIS — I10 Essential (primary) hypertension: Secondary | ICD-10-CM

## 2013-02-04 NOTE — Assessment & Plan Note (Signed)
Pt would like to persue further treatment injections vs surgical to relive his neck pain. Refer to PMR.

## 2013-02-04 NOTE — Assessment & Plan Note (Signed)
No longer at goal. Pt will return to previous diet.

## 2013-02-04 NOTE — Assessment & Plan Note (Signed)
Reviewed ENT note.. No specific treatment available. Hearing aid per pt
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
## 2013-02-04 NOTE — Patient Instructions (Addendum)
Stop eating donuts and ice cream.. That should help cholesterol get back to goal. Keep up the excellent exercise. Stop at front [facility] to set up referral to PMR for neck pain. Look into shingles vaccine if interested.  Stop at lab for stool test on way out.

## 2013-02-04 NOTE — Assessment & Plan Note (Signed)
Well controlled. Continue current medication.  

## 2013-04-11 ENCOUNTER — Other Ambulatory Visit: Payer: Self-pay | Admitting: Family Medicine

## 2013-08-01 ENCOUNTER — Encounter: Payer: Self-pay | Admitting: Cardiovascular Disease

## 2013-08-01 ENCOUNTER — Ambulatory Visit (INDEPENDENT_AMBULATORY_CARE_PROVIDER_SITE_OTHER): Payer: Commercial Managed Care - HMO | Admitting: Cardiovascular Disease

## 2013-08-01 VITALS — BP 130/72 | HR 53 | Ht 62.5 in | Wt 141.0 lb

## 2013-08-01 DIAGNOSIS — I2510 Atherosclerotic heart disease of native coronary artery without angina pectoris: Secondary | ICD-10-CM

## 2013-08-01 DIAGNOSIS — E7800 Pure hypercholesterolemia, unspecified: Secondary | ICD-10-CM

## 2013-08-01 DIAGNOSIS — I6529 Occlusion and stenosis of unspecified carotid artery: Secondary | ICD-10-CM

## 2013-08-01 DIAGNOSIS — I10 Essential (primary) hypertension: Secondary | ICD-10-CM

## 2013-08-01 DIAGNOSIS — J4489 Other specified chronic obstructive pulmonary disease: Secondary | ICD-10-CM

## 2013-08-01 DIAGNOSIS — J449 Chronic obstructive pulmonary disease, unspecified: Secondary | ICD-10-CM

## 2013-08-01 MED ORDER — ATORVASTATIN CALCIUM 40 MG PO TABS: 40.0000 mg | ORAL_TABLET | Freq: Every day | ORAL | Status: DC

## 2013-08-01 MED ORDER — LISINOPRIL 40 MG PO TABS: 40.0000 mg | ORAL_TABLET | Freq: Every day | ORAL | Status: DC

## 2013-08-01 MED ORDER — CARVEDILOL 6.25 MG PO TABS: 6.2500 mg | ORAL_TABLET | Freq: Two times a day (BID) | ORAL | Status: DC

## 2013-08-01 NOTE — Assessment & Plan Note (Signed)
Cholesterol is slightly above goal. He recently restarted oatmeal. Recommended avoiding certain foods to decrease his weight by several pounds

## 2013-08-01 NOTE — Assessment & Plan Note (Signed)
Currently with no symptoms of angina. No further workup at this time. Continue current medication regimen. 

## 2013-08-01 NOTE — Assessment & Plan Note (Signed)
Blood pressure is well controlled on today's visit. No changes made to the medications. 

## 2013-08-01 NOTE — Assessment & Plan Note (Signed)
Mild shortness of breath with heavy exertion. Stable
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
## 2013-08-01 NOTE — Patient Instructions (Signed)
You are doing well. No medication changes were made.  Please call us if you have new issues that need to be addressed before your next appt.  Your physician wants you to follow-up in: 6 months.  You will receive a reminder letter in the mail two months in advance. If you don't receive a letter, please call our office to schedule the follow-up appointment.   

## 2013-08-01 NOTE — Assessment & Plan Note (Signed)
Stable moderate carotid arterial disease, 60% on the left in March 2014. Recheck on his next visit. Disease has been relatively stable

## 2013-11-25 ENCOUNTER — Emergency Department: Payer: Self-pay | Admitting: Emergency Medicine

## 2013-11-25 LAB — CBC WITH DIFFERENTIAL/PLATELET
BASOS PCT: 0.9 %
Basophil #: 0.1 10*3/uL (ref 0.0–0.1)
Eosinophil #: 0.3 10*3/uL (ref 0.0–0.7)
Eosinophil %: 4.9 %
HCT: 39.1 % — AB (ref 40.0–52.0)
HGB: 12.8 g/dL — ABNORMAL LOW (ref 13.0–18.0)
LYMPHS PCT: 25.1 %
Lymphocyte #: 1.5 10*3/uL (ref 1.0–3.6)
MCH: 32.5 pg (ref 26.0–34.0)
MCHC: 32.7 g/dL (ref 32.0–36.0)
MCV: 99 fL (ref 80–100)
MONO ABS: 0.7 x10 3/mm (ref 0.2–1.0)
MONOS PCT: 11 %
NEUTROS PCT: 58.1 %
Neutrophil #: 3.6 10*3/uL (ref 1.4–6.5)
PLATELETS: 198 10*3/uL (ref 150–440)
RBC: 3.93 10*6/uL — ABNORMAL LOW (ref 4.40–5.90)
RDW: 13.2 % (ref 11.5–14.5)
WBC: 6.1 10*3/uL (ref 3.8–10.6)

## 2013-11-25 LAB — BASIC METABOLIC PANEL
Anion Gap: 10 (ref 7–16)
BUN: 14 mg/dL (ref 7–18)
CHLORIDE: 107 mmol/L (ref 98–107)
Calcium, Total: 7.6 mg/dL — ABNORMAL LOW (ref 8.5–10.1)
Co2: 26 mmol/L (ref 21–32)
Creatinine: 1.05 mg/dL (ref 0.60–1.30)
EGFR (Non-African Amer.): >60
GLUCOSE: 132 mg/dL — AB (ref 65–99)
Osmolality: 287 (ref 275–301)
Potassium: 3.9 mmol/L (ref 3.5–5.1)
Sodium: 143 mmol/L (ref 136–145)

## 2013-11-25 LAB — TROPONIN I

## 2014-01-30 ENCOUNTER — Encounter: Payer: Self-pay | Admitting: *Deleted

## 2014-01-30 ENCOUNTER — Ambulatory Visit: Payer: Commercial Managed Care - HMO | Admitting: Cardiovascular Disease

## 2014-02-06 ENCOUNTER — Encounter: Payer: Self-pay | Admitting: Cardiovascular Disease

## 2014-02-06 ENCOUNTER — Ambulatory Visit (INDEPENDENT_AMBULATORY_CARE_PROVIDER_SITE_OTHER): Payer: Commercial Managed Care - HMO | Admitting: Cardiovascular Disease

## 2014-02-06 VITALS — BP 140/70 | Ht 62.5 in | Wt 139.5 lb

## 2014-02-06 DIAGNOSIS — I6521 Occlusion and stenosis of right carotid artery: Secondary | ICD-10-CM

## 2014-02-06 DIAGNOSIS — M542 Cervicalgia: Secondary | ICD-10-CM

## 2014-02-06 DIAGNOSIS — G8929 Other chronic pain: Secondary | ICD-10-CM

## 2014-02-06 DIAGNOSIS — I25810 Atherosclerosis of coronary artery bypass graft(s) without angina pectoris: Secondary | ICD-10-CM

## 2014-02-06 DIAGNOSIS — E7800 Pure hypercholesterolemia, unspecified: Secondary | ICD-10-CM

## 2014-02-06 DIAGNOSIS — Z955 Presence of coronary angioplasty implant and graft: Secondary | ICD-10-CM

## 2014-02-06 MED ORDER — MECLIZINE HCL 25 MG PO TABS: 25.0000 mg | ORAL_TABLET | Freq: Three times a day (TID) | ORAL | Status: DC | PRN

## 2014-02-06 NOTE — Assessment & Plan Note (Signed)
No symptoms concerning for angina. We'll continue aspirin, statin

## 2014-02-06 NOTE — Assessment & Plan Note (Signed)
Currently with no symptoms of angina. No further workup at this time. Continue current medication regimen. 

## 2014-02-06 NOTE — Assessment & Plan Note (Signed)
60% carotid disease on the right. Repeat ultrasound in 2016
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
## 2014-02-06 NOTE — Assessment & Plan Note (Signed)
He continues to have some chronic neck pain radiating to his shoulders. He reports having prior evaluation with [facility] noted

## 2014-02-06 NOTE — Patient Instructions (Addendum)
You are doing well.  Please take meclizine 25 mg as needed every 6 hours for dizziness Number for ENT:   [id number]  Please call us if you have new issues that need to be addressed before your next appt.  Your physician wants you to follow-up in: 6 months.  You will receive a reminder letter in the mail two months in advance. If you don't receive a letter, please call our office to schedule the follow-up appointment.

## 2014-02-06 NOTE — Assessment & Plan Note (Signed)
Blood pressure is well controlled on today's visit. No changes made to the medications. 

## 2014-02-06 NOTE — Assessment & Plan Note (Signed)
Cholesterol above goal. Recommended he watch his diet, aim for weight loss, continue on his statin

## 2014-02-14 ENCOUNTER — Encounter: Payer: Self-pay | Admitting: Cardiovascular Disease

## 2014-04-12 ENCOUNTER — Emergency Department: Payer: Self-pay | Admitting: Student

## 2014-04-14 ENCOUNTER — Ambulatory Visit (INDEPENDENT_AMBULATORY_CARE_PROVIDER_SITE_OTHER)
Admission: RE | Admit: 2014-04-14 | Discharge: 2014-04-14 | Disposition: A | Discharge status: Home / Self Care | Payer: Commercial Managed Care - HMO | Source: Ambulatory Visit | Attending: Family Medicine | Admitting: Family Medicine

## 2014-04-14 ENCOUNTER — Encounter: Payer: Self-pay | Admitting: Family Medicine

## 2014-04-14 ENCOUNTER — Ambulatory Visit (INDEPENDENT_AMBULATORY_CARE_PROVIDER_SITE_OTHER): Payer: Commercial Managed Care - HMO | Admitting: Family Medicine

## 2014-04-14 VITALS — BP 150/68 | HR 63 | Temp 97.7°F | Ht 62.5 in | Wt 139.5 lb

## 2014-04-14 DIAGNOSIS — I10 Essential (primary) hypertension: Secondary | ICD-10-CM

## 2014-04-14 DIAGNOSIS — M5416 Radiculopathy, lumbar region: Secondary | ICD-10-CM

## 2014-04-14 MED ORDER — PREDNISONE 20 MG PO TABS: 60.0000 mg | ORAL_TABLET | Freq: Every day | ORAL | Status: DC

## 2014-04-14 MED ORDER — HYDROCODONE-ACETAMINOPHEN 5-325 MG PO TABS: 1.0000 | ORAL_TABLET | Freq: Four times a day (QID) | ORAL | Status: DC | PRN

## 2014-04-14 NOTE — Assessment & Plan Note (Signed)
Elevated because not taking med in last 5 days and because of pain. Pt will restart.

## 2014-04-14 NOTE — Progress Notes (Signed)
Pre visit review using our clinic review tool, if applicable. No additional management support is needed unless otherwise documented below in the visit note. 

## 2014-04-14 NOTE — Progress Notes (Signed)
   Subjective:    Patient ID: Christian [name], male    DOB: 06/23/1931, 81 y.o.   MRN: [id number]  HPI 81 year old male with history of chronic neck pain, CAD (CABG) presents with new onset low back pain x 6 days.  Woke up one morning with  Right  Buttock pain.  Pain is severe, Hard time walking.  He cannot sleep with pain.  Pain radiates to right lower leg. No numbness, no weakness in right leg.  No recent fall, no new injuries. He is usually very physical active.   No past back surgeries.   Saw chiropractor, Dr. Graciela HusbandsBrugger  Feels may be herniated disc, recommended lumbar MRI and consult with neurosurg.    Review of Systems  Constitutional: Negative for fever and fatigue.  HENT: Negative for ear pain.   Eyes: Negative for pain.  Genitourinary: Negative for urgency and frequency.       No incontinece  Musculoskeletal:       No numbness in groin       Objective:   Physical Exam  Constitutional: He is oriented to person, place, and time. Vital signs are normal. He appears well-developed and well-nourished.  HENT:  Head: Normocephalic.  Right Ear: Hearing normal.  Left Ear: Hearing normal.  Nose: Nose normal.  Mouth/Throat: Oropharynx is clear and moist and mucous membranes are normal.  Neck: Trachea normal. Carotid bruit is not present. No thyroid mass and no thyromegaly present.  Cardiovascular: Normal rate, regular rhythm and normal pulses.  Exam reveals no gallop, no distant heart sounds and no friction rub.   No murmur heard. No peripheral edema  Pulmonary/Chest: Effort normal and breath sounds normal. No respiratory distress.  Musculoskeletal:       Lumbar back: He exhibits tenderness. He exhibits no bony tenderness.  SLR on right  Neurological: He is alert and oriented to person, place, and time. He has normal strength. No cranial nerve deficit or sensory deficit. He displays a negative Romberg sign. Gait abnormal.  Skin: Skin is warm, dry and intact. No rash noted.    Psychiatric: He has a normal mood and affect. His speech is normal and behavior is normal. Thought content normal.          Assessment & Plan:

## 2014-04-14 NOTE — Patient Instructions (Addendum)
We will call with X-ray results.  Start prednisone 3 tabs daily in mornings x 6 days. Can use vicodin for pain. DO not take ibuprofen or aleve with prednisone.  Call if not improving some by Monday.   Take BP med.  Go to ER with severe pain. Follow up with me in 2 weeks for back pain.

## 2014-04-20 ENCOUNTER — Other Ambulatory Visit: Payer: Self-pay | Admitting: Family Medicine

## 2014-04-20 ENCOUNTER — Telehealth: Payer: Self-pay | Admitting: Family Medicine

## 2014-04-20 NOTE — Telephone Encounter (Signed)
Last office visit 04/14/2014.  Last filled 04/14/2014 for #18 with no refills.

## 2014-04-20 NOTE — Telephone Encounter (Signed)
Let pt know we cannot continue prednisone long term.  HAs he has any benefit from it?  Current symtpoms? Let me know and I can make additional recommendations in either case.

## 2014-04-21 MED ORDER — PREDNISONE 20 MG PO TABS: 60.0000 mg | ORAL_TABLET | Freq: Every day | ORAL | Status: DC

## 2014-04-21 NOTE — Telephone Encounter (Signed)
Mr. Carlena SaxBlair notified as instructed by telephone.  [place] [facility] [address] [place].  Appointment scheduled with Dr. Ermalene SearingBedsole 04/28/2014 at 9:30 am for further evaluation of leg pain.

## 2014-04-21 NOTE — Telephone Encounter (Signed)
Spoke with [name].  He states the pain is really awful.  It starts at his right buttocks and radiates down his leg to the bottom of his foot.  He states the prednisone really helped with his symptoms.  Please advise.

## 2014-04-21 NOTE — Telephone Encounter (Signed)
Okay to refill prescription but have him follow up end of next week as I think we will ned to get him set up with further eval like PT and or MRI lumbar spine.

## 2014-04-21 NOTE — Addendum Note (Signed)
Addended by: Damita LackLORING, DONNA S on: 04/21/2014 02:37 PM   Modules accepted: Orders

## 2014-04-26 ENCOUNTER — Other Ambulatory Visit: Payer: Self-pay | Admitting: Family Medicine

## 2014-04-27 ENCOUNTER — Encounter: Payer: Self-pay | Admitting: Family Medicine

## 2014-04-27 ENCOUNTER — Telehealth: Payer: Self-pay | Admitting: Family Medicine

## 2014-04-27 ENCOUNTER — Ambulatory Visit (INDEPENDENT_AMBULATORY_CARE_PROVIDER_SITE_OTHER)
Admission: RE | Admit: 2014-04-27 | Discharge: 2014-04-27 | Disposition: A | Discharge status: Home / Self Care | Payer: Commercial Managed Care - HMO | Source: Ambulatory Visit | Attending: Family Medicine | Admitting: Family Medicine

## 2014-04-27 ENCOUNTER — Ambulatory Visit (INDEPENDENT_AMBULATORY_CARE_PROVIDER_SITE_OTHER): Payer: Commercial Managed Care - HMO | Admitting: Family Medicine

## 2014-04-27 VITALS — BP 134/66 | HR 79 | Temp 97.6°F | Ht 62.5 in | Wt 138.8 lb

## 2014-04-27 DIAGNOSIS — M79674 Pain in right toe(s): Secondary | ICD-10-CM

## 2014-04-27 DIAGNOSIS — M5416 Radiculopathy, lumbar region: Secondary | ICD-10-CM

## 2014-04-27 MED ORDER — PREDNISONE 20 MG PO TABS: ORAL_TABLET | ORAL | Status: DC

## 2014-04-27 NOTE — Progress Notes (Signed)
Dr. Karleen Hampshire T. Othniel Maret, MD, CAQ Sports [facility] [address] [place], [address] Phone: [phone] Fax: [phone]  04/27/2014  Patient: Christian [name], MRN: [id number], DOB: 1931/04/10, 81 y.o.  Primary Physician:  Kerby Nora, MD  Chief Complaint: Swollen Right Big Toe and Hip Pain  Subjective:   Christian [name] is a 81 y.o. very Christian male patient who presents with the following:  Christian [name] who I remember from prior visits.  He presents today with a chief complaint about his RIGHT great toe hurting, but he also wants to follow-up and discuss his pain that he describes as hip pain on the RIGHT.  He is describing posterior buttocks pain and back pain with radicular symptoms on the RIGHT side.  He has been on some short courses of prednisone and seen my partner.  I reviewed his x-rays which are significant for both significant spondyloarthropathy and degenerative disc disease.  He had a falling episode approximately 10 or 11 days ago, and since then both his back and his RIGHT great toe with been bothering him.  He is able to walk without much of a significant limp.  There is no bruising.  Larey Seat about 10-11 days ago.   R great toe is really sore.   Past Medical History, Surgical History, Social History, Family History, Problem List, Medications, and Allergies have been reviewed and updated if relevant.  GEN: No fevers, chills. Nontoxic. Primarily MSK c/o today. MSK: Detailed in the HPI GI: tolerating PO intake without difficulty Neuro: No numbness, parasthesias, or tingling associated. Otherwise the pertinent positives of the ROS are noted above.   Objective:   BP 134/66 mmHg  Pulse 79  Temp(Src) 97.6 F (36.4 C) (Oral)  Ht 5' 2.5" (1.588 m)  Wt 138 lb 12 oz (62.937 kg)  BMI 24.96 kg/m2   GEN: WDWN, NAD, Non-toxic, Alert & Oriented x 3 HEENT: Atraumatic, Normocephalic.  Ears and Nose: No external  deformity. EXTR: No clubbing/cyanosis/edema NEURO: Normal gait.  PSYCH: Normally interactive. Conversant. Not depressed or anxious appearing.  Calm demeanor.   FEET: R Echymosis: no Edema: no ROM: full LE B Gait: heel toe, non-antalgic MT pain: no Callus pattern: none Lateral Mall: NT Medial Mall: NT Talus: NT Navicular: NT Cuboid: NT Calcaneous: NT Metatarsals: NT 5th MT: NT Phalanges: NT Achilles: NT Plantar Fascia: NT Fat Pad: NT Peroneals: NT Post Tib: NT Great Toe: some tenderness along the digit.  Less at the MTP.  No bruising.  No appreciable swelling. Ant Drawer: neg ATFL: NT CFL: NT Deltoid: NT Sensation: intact   He appears to be intact from a neurovascular standpoint on bilateral lower extremities.  His deep tendon reflexes are 2+.  Sensation appears intact.  Strength is intact.  Does have tenderness relatively diffusely on the bilateral paraspinal musculatures from approximately L2-S1.  Radiology: Dg Lumbar Spine Complete  04/14/2014   CLINICAL DATA:  Low back pain with right-sided radiculopathy  EXAM: LUMBAR SPINE - COMPLETE 4+ VIEW  COMPARISON:  None.  FINDINGS: Five lumbar type vertebral bodies are well visualized. Vertebral body height is well maintained. Osteophytic changes and disc space narrowing is noted at L4-5 and L5-S1. 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 Grade 1 anterolisthesis of L4 on L5 is noted. No acute abnormality is seen. Aortic calcifications are seen.  IMPRESSION: Multilevel degenerative change without acute abnormality.   Electronically Signed   By: Eulah Pont.D.  On: 04/14/2014 14:33   Dg Toe Great Right  04/27/2014   CLINICAL DATA:  Toe pain, no injury  EXAM: RIGHT GREAT TOE  COMPARISON:  None.  FINDINGS: There is no evidence of fracture or dislocation. There is no evidence of arthropathy or other focal bone abnormality. Soft tissues are unremarkable.  IMPRESSION: Negative.   Electronically Signed   By:  Marlan Palauharles  Clark M.D.   On: 04/27/2014 16:50     Assessment and Plan:   Toe pain, right - Plan: DG Toe Great Right  Acute right lumbar radiculopathy  I am not overly concerned with this toe.  No fracture.  He may have a minor capsular injury or other soft tissue injury, but this should get better without much significant intervention.  He certainly has lumbar radiculopathy with associated spondyloarthropathy and degenerative disc disease, either disc tissue or bone spurs likely encroaching on nerve on the RIGHT.  I would give this more time prior to considering any type of intervention in this case.  I think it is reasonable to do a longer taper of some oral corticosteroids additionally.  He has had 2 tapers previously, but they were both quite short.  He was scheduled to follow-up with my partner tomorrow, and I recommended that he wait and push that out to a few weeks from now.  New Prescriptions   PREDNISONE (DELTASONE) 20 MG TABLET    2 tabs for 7 days, then 1 tablet for 7 days   Orders Placed This Encounter  Procedures  . DG Toe Great Right    Signed,  Elpidio GaleaSpencer T. Shahiem Bedwell, MD   Patient's Medications  New Prescriptions   PREDNISONE (DELTASONE) 20 MG TABLET    2 tabs for 7 days, then 1 tablet for 7 days  Previous Medications   ACETAMINOPHEN 650 MG TABS    Take 2 tablets by mouth 2 (two) times daily.   ASPIRIN 81 MG TABLET    Take 81 mg by mouth daily.   ATORVASTATIN (LIPITOR) 40 MG TABLET    Take 1 tablet (40 mg total) by mouth daily at 6 PM.   CARVEDILOL (COREG) 6.25 MG TABLET    Take 1 tablet (6.25 mg total) by mouth 2 (two) times daily.   HYDROCODONE-ACETAMINOPHEN (NORCO/VICODIN) 5-325 MG PER TABLET    Take 1 tablet by mouth every 6 (six) hours as needed for moderate pain.   LISINOPRIL (PRINIVIL,ZESTRIL) 40 MG TABLET    Take 1 tablet (40 mg total) by mouth daily.  Modified Medications   No medications on file  Discontinued Medications   PREDNISONE (DELTASONE) 20 MG TABLET     Take 3 tablets (60 mg total) by mouth daily with breakfast.

## 2014-04-27 NOTE — Telephone Encounter (Signed)
Opened in erro

## 2014-04-27 NOTE — Progress Notes (Signed)
Pre visit review using our clinic review tool, if applicable. No additional management support is needed unless otherwise documented below in the visit note. 

## 2014-04-28 ENCOUNTER — Ambulatory Visit: Payer: Commercial Managed Care - HMO | Admitting: Family Medicine

## 2014-05-07 ENCOUNTER — Other Ambulatory Visit: Payer: Self-pay | Admitting: Family Medicine

## 2014-05-07 NOTE — Telephone Encounter (Signed)
Last office visit 04/27/2014 with Dr. Patsy Lageropland.  Last refilled 04/27/2014 for #21 with no refills.  Refill?

## 2014-05-08 ENCOUNTER — Other Ambulatory Visit: Payer: Self-pay | Admitting: *Deleted

## 2014-05-08 NOTE — Telephone Encounter (Signed)
Patient came to the office and requested a refill on Prednisone 20 mg, for his leg and hip. Pharmacy [facility]

## 2014-05-08 NOTE — Telephone Encounter (Signed)
Refill refused. Not good to be on longterm prednisone ( this is 3rd taper). This more recent longer course of prednisone should have controlled pain some, if not fully would recommend further eval with MRI lumbar spine.

## 2014-05-09 NOTE — Telephone Encounter (Signed)
Left message for Christian [name] that Dr. Ermalene SearingBedsole is not going to refill his prednisone.  I advised it is not good to be on prednison longterm.  If he pain is not controlled some after the last prednisone taper, then he will need to schedule an appointment with Dr. Ermalene SearingBedsole for further evaluation with MRI on his lumbar spine.

## 2014-05-19 ENCOUNTER — Ambulatory Visit: Payer: Commercial Managed Care - HMO | Admitting: Family Medicine

## 2014-05-19 NOTE — Assessment & Plan Note (Signed)
Given age eval with X-ray. Treat with prednisone taper, home stretches. Can use vicodin for pain.

## 2014-06-01 ENCOUNTER — Ambulatory Visit (INDEPENDENT_AMBULATORY_CARE_PROVIDER_SITE_OTHER): Payer: Commercial Managed Care - HMO | Admitting: Family Medicine

## 2014-06-01 ENCOUNTER — Ambulatory Visit: Payer: Self-pay | Admitting: Family Medicine

## 2014-06-01 ENCOUNTER — Encounter: Payer: Self-pay | Admitting: Family Medicine

## 2014-06-01 VITALS — BP 135/61 | HR 64 | Temp 98.3°F | Ht 62.5 in | Wt 136.5 lb

## 2014-06-01 DIAGNOSIS — M5416 Radiculopathy, lumbar region: Secondary | ICD-10-CM

## 2014-06-01 DIAGNOSIS — M21371 Foot drop, right foot: Secondary | ICD-10-CM | POA: Insufficient documentation

## 2014-06-01 DIAGNOSIS — R29898 Other symptoms and signs involving the musculoskeletal system: Secondary | ICD-10-CM | POA: Insufficient documentation

## 2014-06-01 MED ORDER — HYDROCODONE-ACETAMINOPHEN 5-325 MG PO TABS: 1.0000 | ORAL_TABLET | Freq: Four times a day (QID) | ORAL | Status: DC | PRN

## 2014-06-01 NOTE — Addendum Note (Signed)
Addended by: Kerby NoraBEDSOLE, Daquarius Dubeau E on: 06/01/2014 05:37 PM   Modules accepted: Orders

## 2014-06-01 NOTE — Patient Instructions (Addendum)
Stop at front desk to set up MRI ASAP. Can use vicodin for pain. Do not drive.

## 2014-06-01 NOTE — Progress Notes (Signed)
Pre visit review using our clinic review tool, if applicable. No additional management support is needed unless otherwise documented below in the visit note. 

## 2014-06-01 NOTE — Progress Notes (Addendum)
Subjective:    Patient ID: Christian [name], male    DOB: 07/13/1931, 81 y.o.   MRN: [id number]  HPI  81 year old male with recent back pain on prednisone x 3 courses presents with new onset  Swelling and severe pain in 1 and 2nd toes in last several weeks. Slightly red, cannot move his right foot or toes. He did not come in earlier becomes of finances and did not want to be a bother.  He was seen initially on 1/8 dx with lumbar radiculopathy, no weakness or numbness that hd been ongoing x 6 days prior. No improvement with  OTC meds. Treated with 6 day prednisone taper.  X-ray 1/8 showed Osteophytic changes and disc space narrowing is noted at L4-5 and L5-S1. 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 Grade 1 anterolisthesis of L4 on L5 is noted.   Pt call with continued pain on 1/14 with return of pain...repeated prednisone taper but told to follow up.   ON 1/21 followed up with my partner Dr. Patsy Lageropland. Was having toe pain at that time and right leg pain, but no weakness. Had lost balance due to pain and fallen 10 days earlier.  X-ray was performed on toe but was negative. Pt was treated with an longer course of steroid.  TODAY: Pain in back is better, some soreness in right buttock. Pain running down right leg, sever decreased in sensation.e. He is in wheelchair because he cannot weight bear because of pain in  rightfoot.  In last few weeks he increase in toe pain and  noted newdifficulty with decreased strength in right leg. He is unable to lift foot up  ie flex ankle.  Right foot is Review of Systems  Constitutional: Negative for fever and fatigue.  HENT: Negative for ear pain.   Eyes: Positive for redness.  Respiratory: Negative for chest tightness.   Cardiovascular: Negative for chest pain.  Gastrointestinal: Negative for abdominal pain.       Objective:   Physical Exam  Constitutional: Vital signs are normal. He appears well-developed and  well-nourished.  In wheelchair  HENT:  Head: Normocephalic.  Right Ear: Hearing normal.  Left Ear: Hearing normal.  Nose: Nose normal.  Mouth/Throat: Oropharynx is clear and moist and mucous membranes are normal.  Eyes:  Left eye with lower led droop, bilateral eye redness  Neck: Trachea normal. Neck supple. Carotid bruit is not present. No thyroid mass and no thyromegaly present.  Decreased ROM neck  Cardiovascular: Normal rate, regular rhythm and normal pulses.  Exam reveals no gallop, no distant heart sounds and no friction rub.   No murmur heard. No peripheral edema  Pulmonary/Chest: Effort normal and breath sounds normal. No respiratory distress.  Abdominal: Soft. Bowel sounds are normal. There is no tenderness.  Musculoskeletal:       Lumbar back: He exhibits decreased range of motion and tenderness.  ttp in right buttocks  positive SLR on right  Neurological: He displays atrophy. He displays no tremor. A sensory deficit is present. He exhibits abnormal muscle tone. He displays no seizure activity. Coordination and gait abnormal.  Decreased hip aduction, knee flexion and extension about 4/5. 2/5 ankle  Dorsiflexion and 3/5 ankle flexion  When trys to weight bear.. Limp and has foot drop on right.  Skin: Skin is warm, dry and intact. No rash noted.  Very tender in right 1 and 2nd toes and dorsal foot (L4 L5 distribution).  Swelling in 1 and 2  toes on right, slight erythema, no increase in warmth.  no ankle swelling,  Psychiatric: He has a normal mood and affect. His speech is normal and behavior is normal. Thought content normal.          Assessment & Plan:  > 30 min spent with pt and on phone call to son. Son will pick pt up as he should not drive.  Concern for severe nerve compression in L4 and L5, progressive in right lumbar spine resulting in profound weakness on right, with foot drop.  Will send for stat MRI lumbar spine.  Treat pain with vicodin. Likely will need  urgent neuro surgical referral for likely surgery. Toe pain is likely related to back issues as in L4 L5 distribution.   Addendum: MRI results show L3L4 not develop in utero, severe spinal stenosis in L4L5 , multiple levels of disc herniation.  Discussed results with pt and son.. Will urgently refer to neurosurgery to consider whether ESI or surgery next best step given rapid progression of weakness.

## 2014-06-02 ENCOUNTER — Encounter: Payer: Self-pay | Admitting: Family Medicine

## 2014-06-09 ENCOUNTER — Telehealth: Payer: Self-pay | Admitting: Family Medicine

## 2014-06-09 NOTE — Telephone Encounter (Signed)
Mr Christian [name] sawdr yesterday about his foot.  This dr wanted to make sure you check for gout Mr Christian [name] is going to call back with the name of the dr

## 2014-06-12 NOTE — Telephone Encounter (Signed)
Mr. Carlena SaxBlair is scheduled to see Dr. Ermalene SearingBedsole 06/13/2014.  Can discuss at that office visit.

## 2014-06-12 NOTE — Telephone Encounter (Signed)
Noted  

## 2014-06-13 ENCOUNTER — Ambulatory Visit (INDEPENDENT_AMBULATORY_CARE_PROVIDER_SITE_OTHER): Payer: Commercial Managed Care - HMO | Admitting: Family Medicine

## 2014-06-13 ENCOUNTER — Encounter: Payer: Self-pay | Admitting: Family Medicine

## 2014-06-13 VITALS — BP 110/45 | HR 74 | Temp 98.1°F | Ht 62.5 in | Wt 135.5 lb

## 2014-06-13 DIAGNOSIS — M79674 Pain in right toe(s): Secondary | ICD-10-CM

## 2014-06-13 NOTE — Assessment & Plan Note (Signed)
Will eval for gout.  ? If pain is also due to foot drop. Pt to be fitted with brace soon.

## 2014-06-13 NOTE — Patient Instructions (Signed)
We will call with lab results   

## 2014-06-13 NOTE — Progress Notes (Signed)
   Subjective:    Patient ID: Christian [name], male    DOB: 09/07/1931, 81 y.o.   MRN: [id number]  HPI 81 year old male with low back pain and radiculopathy with recent right leg weakness and foot drop returns for follow up. He was referred to Neurosurgery for eval/treat following MRI of lumbar spine showing: multilevel foraminal and spinal stenosis.  Neuro surgery  (appt 6 days ago) remained concerned that the foot pain may not all be from his back.  Per pt. Recommended eval for gout.  Given a brace for foot to treat foot drop. He felt surgery not indicated, ? Risky. Pt has follow up there in next month.    Pain is in 4th digit and across the back of foot and in great toe.  No redness.  Very sensitive to foot touch anything. Not really a high purine diet.   He does not remember any injury. No ETOH use.  He does walk " funny" given foot drop  Review of Systems  Constitutional: Negative for diaphoresis and fatigue.  HENT: Negative for ear pain.   Eyes: Negative for pain.  Respiratory: Negative for shortness of breath.   Cardiovascular: Negative for chest pain and leg swelling.  Gastrointestinal: Negative for abdominal pain.       Objective:   Physical Exam  Constitutional: Vital signs are normal. He appears well-developed and well-nourished.  In wheelchair  HENT:  Head: Normocephalic.  Right Ear: Hearing normal.  Left Ear: Hearing normal.  Nose: Nose normal.  Mouth/Throat: Oropharynx is clear and moist and mucous membranes are normal.  Eyes:  Left eye with lower led droop, bilateral eye redness  Neck: Trachea normal. Neck supple. Carotid bruit is not present. No thyroid mass and no thyromegaly present.  Decreased ROM neck  Cardiovascular: Normal rate, regular rhythm and normal pulses.  Exam reveals no gallop, no distant heart sounds and no friction rub.   No murmur heard. No peripheral edema  Pulmonary/Chest: Effort normal and breath sounds normal. No respiratory  distress.  Abdominal: Soft. Bowel sounds are normal. There is no tenderness.  Musculoskeletal:       Lumbar back: He exhibits decreased range of motion and tenderness.  ttp in right buttocks  positive SLR on right  Neurological: He displays atrophy. He displays no tremor. A sensory deficit is present. He exhibits abnormal muscle tone. He displays no seizure activity. Coordination and gait abnormal.  Decreased hip aduction, knee flexion and extension about 4/5. 2/5 ankle  Dorsiflexion and 3/5 ankle flexion  When trys to weight bear.. Limp and has foot drop on right.  Skin: Skin is warm, dry and intact. No rash noted.  Very tender in right 1  And 4 th toes and dorsal foot (L4 L5 distribution).  no swelling, no redness, no increase in warmth.  no ankle swelling.  Psychiatric: He has a normal mood and affect. His speech is normal and behavior is normal. Thought content normal.          Assessment & Plan:

## 2014-06-13 NOTE — Progress Notes (Signed)
Pre visit review using our clinic review tool, if applicable. No additional management support is needed unless otherwise documented below in the visit note. 

## 2014-06-14 LAB — URIC ACID: Uric Acid, Serum: 5.8 mg/dL (ref 4.0–7.8)

## 2014-07-06 ENCOUNTER — Telehealth: Payer: Self-pay

## 2014-07-06 NOTE — Telephone Encounter (Signed)
Todd pts son said pts phone not working now and pt has frequency of urine at night,voids 6 x during the night and pt cannot sleep. No burning or pain when urinates and no fever. Pt request appt ASAP to be seen; scheduled with Dr Ermalene SearingBedsole on 07/07/14 at 10:45 AM.

## 2014-07-07 ENCOUNTER — Ambulatory Visit (INDEPENDENT_AMBULATORY_CARE_PROVIDER_SITE_OTHER): Payer: Commercial Managed Care - HMO | Admitting: Family Medicine

## 2014-07-07 ENCOUNTER — Encounter: Payer: Self-pay | Admitting: Family Medicine

## 2014-07-07 VITALS — BP 128/56 | HR 70 | Temp 97.6°F | Resp 18 | Ht 62.5 in | Wt 135.0 lb

## 2014-07-07 DIAGNOSIS — R350 Frequency of micturition: Secondary | ICD-10-CM

## 2014-07-07 LAB — POCT URINALYSIS DIPSTICK
BILIRUBIN UA: NEGATIVE
Blood, UA: NEGATIVE
Glucose, UA: NEGATIVE
Ketones, UA: NEGATIVE
NITRITE UA: NEGATIVE
PH UA: 6
PROTEIN UA: NEGATIVE
Spec Grav, UA: >=1.03
Urobilinogen, UA: 0.2

## 2014-07-07 LAB — POCT UA - MICROSCOPIC ONLY
Casts, Ur, LPF, POC: 0
Crystals, Ur, HPF, POC: 0
Mucus, UA: 0
RBC, URINE, MICROSCOPIC: 0
WBC, UR, HPF, POC: 0
Yeast, UA: 0

## 2014-07-07 MED ORDER — TAMSULOSIN HCL 0.4 MG PO CAPS: 0.4000 mg | ORAL_CAPSULE | Freq: Every day | ORAL | Status: DC

## 2014-07-07 NOTE — Telephone Encounter (Signed)
Noted  

## 2014-07-07 NOTE — Progress Notes (Signed)
   Subjective:    Patient ID: Lenon Ahmadiichard J Rezabek, male    DOB: 07/23/1931, 81 y.o.   MRN: [id number]  HPI   81 year old male presents with nocturia x 2 months ( 5-6 times a night). No urinay frequency during the day.  Slight burning with urination. No urgency, has to have hand under water to get urine started. No emptying completely. Has to push on penis to get urine out. Flow diminished at night. Better during the day.  No fever, no back pain. No abdminal pain.    Review of Systems  Constitutional: Negative for fever and fatigue.  HENT: Negative for ear pain.   Eyes: Negative for pain.  Respiratory: Negative for shortness of breath.   Cardiovascular: Negative for chest pain.  Gastrointestinal: Negative for abdominal pain, diarrhea and constipation.  Musculoskeletal: Positive for back pain.       Continue toe pain and foot drop treated by neurosurg.       Objective:   Physical Exam  Constitutional: Vital signs are normal. He appears well-developed and well-nourished.  In wheelchair  HENT:  Head: Normocephalic.  Right Ear: Hearing normal.  Left Ear: Hearing normal.  Nose: Nose normal.  Mouth/Throat: Oropharynx is clear and moist and mucous membranes are normal.  Eyes:  Left eye with lower led droop, bilateral eye redness  Neck: Trachea normal. Neck supple. Carotid bruit is not present. No thyroid mass and no thyromegaly present.  Decreased ROM neck  Cardiovascular: Normal rate, regular rhythm and normal pulses.  Exam reveals no gallop, no distant heart sounds and no friction rub.   No murmur heard. No peripheral edema  Pulmonary/Chest: Effort normal and breath sounds normal. No respiratory distress.  Abdominal: Soft. Bowel sounds are normal. There is no hepatosplenomegaly. There is no tenderness. There is no rebound and no CVA tenderness. No hernia.  Musculoskeletal:       Lumbar back: He exhibits decreased range of motion and tenderness.  ttp in right buttocks  positive  SLR on right  Neurological: He displays atrophy. He displays no tremor. A sensory deficit is present. He exhibits abnormal muscle tone. He displays no seizure activity. Coordination and gait abnormal.  Decreased hip aduction, knee flexion and extension about 4/5. 2/5 ankle  Dorsiflexion and 3/5 ankle flexion  When trys to weight bear.. Limp and has foot drop on right.  Skin: Skin is warm, dry and intact. No rash noted.  Very tender in right 1  And 4 th toes and dorsal foot (L4 L5 distribution).  no swelling, no redness, no increase in warmth.  no ankle swelling.  Psychiatric: He has a normal mood and affect. His speech is normal and behavior is normal. Thought content normal.          Assessment & Plan:

## 2014-07-07 NOTE — Patient Instructions (Signed)
No clear infection. Likely prostate issue , BPH.  Will start flomax daily. Ffollow up in 4 weeks 30 min OV.

## 2014-07-07 NOTE — Assessment & Plan Note (Signed)
No clear infection. Likely prostate issue , BPH.  Will start flomax daily. Follow up in 4 weeks.

## 2014-07-07 NOTE — Progress Notes (Signed)
Pre visit review using our clinic review tool, if applicable. No additional management support is needed unless otherwise documented below in the visit note. 

## 2014-07-28 NOTE — Discharge Summary (Signed)
PATIENT NAME:  Christian AhmadiBLAIR, Maciah J MR#:  [id number] DATE OF BIRTH:  1931/08/31   DATE OF ADMISSION:  06/10/2012 DATE OF DISCHARGE:  06/11/2012  PRIMARY CARE PHYSICIAN:  Excell SeltzerAmy E. Bedsole, MD  DISCHARGE DIAGNOSES:   1.  Transient ischemic attack.  2.  Slurred speech.  3.  Hypertension.  4.  Hyperlipidemia.  5.  Gastroesophageal reflux disease.   CONSULTS:  None.   IMAGING STUDIES DONE:   1.  Include MRI of the brain without contrast which showed mild small vessel white matter ischemic changes without acute stroke.  2.  Ultrasound carotid Dopplers showed stenosis greater than 50%.  3.  CT scan of the head without contrast showed no acute process.  4.  A 2-D echocardiogram showed ejection fraction of 55 to 60% with no thrombus found.   ADMITTING HISTORY AND PHYSICAL AND HOSPITAL COURSE:  Please see detailed H and P dictated by Dr. Cherlynn KaiserSainani on 06/10/2012. In brief, this is a 81 year old male patient admitted to the Emergency Room from primary care physician's office when he developed sudden onset of slurred speech, dizziness and weakness. The patient has a history of carotid artery stenosis and was as an outpatient at [facility] getting a carotid duplex done when he had some slurred speech, dizziness and was sent to the hospital. In the hospital, the patient was on a tele floor, had an MRI of the brain, carotid Doppler studies, CT scan of the head and 2-D echocardiogram which showed no acute abnormalities. No acute stroke on MRI. He did have the pre-existing carotid stenosis of less than 70%. He has been discharged home in stable condition on antiplatelet agents, statin and blood pressure medications to follow up with primary care physician.   DISCHARGE MEDICATIONS:   1.  Atorvastatin 40 mg oral once a day.  2.  Coreg 6.25 mg oral once a day.  3.  Lisinopril 40 mg oral once a day.  4.  Protonix 40 mg oral once a day.  5.  Aspirin 325 mg oral once a day.   DISCHARGE INSTRUCTIONS:  Follow up with  primary care physician in a week. Low-sodium, low-fat, low-cholesterol diet. Activity as tolerated.   TIME SPENT ON DAY OF DISCHARGE IN DISCHARGE ACTIVITY:  30 minutes.    ____________________________ Molinda BailiffSrikar R. Sudini, MD srs:si D: 06/15/2012 20:50:34 ET T: 06/15/2012 21:25:26 ET JOB#: [id number]  cc: Wardell HeathSrikar R. Elpidio AnisSudini, MD, <Dictator> Excell SeltzerAmy E. Bedsole, MD Orie FishermanSRIKAR R SUDINI MD ELECTRONICALLY SIGNED 06/24/2012 13:19

## 2014-07-28 NOTE — H&P (Signed)
PATIENT NAME:  Christian [name], Christian [name] MR#:  [id number] DATE OF BIRTH:  06-07-1931  DATE OF ADMISSION:  06/10/2012  PRIMARY CARE PHYSICIAN:  Dr. Kerby NoraAmy Bedsole  CHIEF COMPLAINT:  Slurred speech and weakness.   HISTORY OF PRESENT ILLNESS:  This is a 81 year old male who presents to the Emergency Room from a primary care physician's office when he developed sudden onset of slurred speech and dizziness and weakness. The patient says that he has a history of carotid artery stenosis and was as an outpatient at [facility] getting a carotid duplex to be study done. While the study was going on, the patient started to develop sudden onset of weakness and dizziness. Shortly thereafter, he apparently was speaking in a slurred speech and garbled nature. This was noticed by the technician who was performing the study. She called EMS and he was brought to the ER. The patient does not recall himself having slurred speech. The patient said that in the past few weeks to a few months, he has noticed that when he takes a shower he feels more and more dizzy and has to hold onto something. He also has developed some lower extremity numbness and tingling over the past few months. Given his progressive nature of neurological symptoms, hospitalist services were contacted for further treatment and evaluation.   REVIEW OF SYSTEMS:  CONSTITUTIONAL:  No documented fever. No weight gain. No weight loss.  EYES:  No blurry or double vision.  ENT:  No tinnitus. No postnasal drip. No redness of oropharynx.  RESPIRATORY:  No cough. No wheeze. No hemoptysis.  CARDIOVASCULAR:  No chest pain. No orthopnea. No palpitations or syncope.  GASTROINTESTINAL:  No nausea. No vomiting. No diarrhea. No abdominal pain. No melena or hematochezia.  GENITOURINARY:  No dysuria or hematuria.  ENDOCRINE:  No polyuria or nocturia. No heat or heat or cold intolerance. HEMATOLOGIC:  No anemia. No bruising. No bleeding.  INTEGUMENT:  No rashes. No lesions.   MUSCULOSKELETAL:  No arthritis. No swelling. No gout.  NEUROLOGIC:  No numbness or tingling. No ataxia. No seizure-type activity.  PSYCHIATRIC:  No anxiety. No insomnia. No ADD.   PAST MEDICAL HISTORY:  Consistent with hypertension, hyperlipidemia, GERD and a history of coronary artery stenosis.   ALLERGIES:  No known drug allergies.   SOCIAL HISTORY:  Used to be a smoker, quit about 20+ years ago. No alcohol abuse. No illicit drug abuse. Lives at home by himself.   FAMILY HISTORY:  Both mother and father had problems with heart disease. He has many brothers and sisters who have had bypass surgery.   CURRENT MEDICATIONS:  As follows:  Aspirin 325 mg daily, atorvastatin 40 mg daily, Coreg 6.25 mg b.i.d., lisinopril 40 mg daily and Protonix 40 mg daily.   PHYSICAL EXAMINATION ON ADMISSION: VITAL SIGNS:  Temperature 97, pulse 52, respirations 18, blood pressure 190/91, sats 98% on room air.  GENERAL:  He is a pleasant-appearing male in no apparent distress.  HEAD, EYES, EARS, NOSE AND THROAT:  Atraumatic, normocephalic. Extraocular muscles are intact. Pupils equal, round and reactive to light. Sclerae anicteric. No conjunctival injection. No pharyngeal erythema.  NECK:  Supple. No jugular venous distention. No bruits. No lymphadenopathy or thyromegaly.  HEART:  Regular rate and rhythm. No murmurs. No rubs. No clicks.  LUNGS:  Clear to auscultation bilaterally. No rales or rhonchi. No wheezes.  ABDOMEN: Soft, flat, nontender, nondistended. Has good bowel sounds. No hepatosplenomegaly appreciated.  EXTREMITIES:  No evidence of any cyanosis, clubbing or  peripheral edema. Has +2 pedal and radial pulses bilaterally.  NEUROLOGIC: The patient is alert, awake and oriented x 3 with no focal motor or sensory deficits appreciated bilaterally.  SKIN:  Moist and warm with no rash appreciated.  LYMPHATIC: There is no cervical lymphadenopathy.  LABORATORY DATA:  Shows serum glucose of 94, BUN 18,  creatinine 1.2, sodium 140, potassium 4.1, chloride 109, bicarb 28. The patient's LFTs are within normal limits. Troponin less than 0.02. White cell count 7, hemoglobin 13.5, hematocrit 39.6, platelet count 185, INR is 0.9.   The patient did have a CT head done without contrast which showed no acute intracranial process.   ASSESSMENT AND PLAN: This is a 81 year old male with a history of hypertension, hyperlipidemia, history of carotid artery stenosis, history of gastroesophageal reflux disease presents to the hospital with slurred speech and weakness and suspected transient ischemic attack.   PROBLEMS: 1.  Transient ischemic attack/cerebrovascular accident:  This is a likely diagnosis given the patient's transient neurological symptoms, which have now resolved. He developed some dizziness, weakness and slurred speech which is not completely resolved and he is asymptomatic. He was apparently getting a carotid study for workup of carotid stenosis as an outpatient. The study was never finished. I will observe him overnight. We will check two 4-hour neuro checks. His CT head is initially negative. I will get a 2-dimensional echo MRI of his brain. Also get a repeat carotid study as it was never finished as mentioned. We will continue aspirin. We will continue statin.  2.  Hypertension:  Tolerates some element of high blood pressure given the possibility of acute stroke. Continue Coreg.  Continue lisinopril.  3.  Hyperlipidemia:  Continue atorvastatin. 4.  Gastroesophageal reflux disease:  Continue Protonix.   CODE STATUS:  THE PATIENT IS A FULL CODE.   TIME SPENT:  45 minutes    ____________________________ Rolly Pancake. Cherlynn Kaiser, MD vjs:ce D: 06/10/2012 16:37:41 ET T: 06/10/2012 17:12:32 ET JOB#: [id number]  cc: Rolly Pancake. Cherlynn Kaiser, MD, <Dictator> Houston Siren MD ELECTRONICALLY SIGNED 06/16/2012 8:14

## 2014-08-22 ENCOUNTER — Encounter: Payer: Self-pay | Admitting: Family Medicine

## 2014-08-22 ENCOUNTER — Ambulatory Visit (INDEPENDENT_AMBULATORY_CARE_PROVIDER_SITE_OTHER): Payer: Commercial Managed Care - HMO | Admitting: Family Medicine

## 2014-08-22 VITALS — BP 110/60 | HR 59 | Temp 98.4°F | Ht 62.5 in | Wt 145.5 lb

## 2014-08-22 DIAGNOSIS — J441 Chronic obstructive pulmonary disease with (acute) exacerbation: Secondary | ICD-10-CM | POA: Diagnosis not present

## 2014-08-22 DIAGNOSIS — L219 Seborrheic dermatitis, unspecified: Secondary | ICD-10-CM | POA: Insufficient documentation

## 2014-08-22 MED ORDER — AZITHROMYCIN 250 MG PO TABS: ORAL_TABLET | ORAL | Status: DC

## 2014-08-22 MED ORDER — KETOCONAZOLE 2 % EX CREA: 1.0000 "application " | TOPICAL_CREAM | Freq: Two times a day (BID) | CUTANEOUS | Status: DC

## 2014-08-22 NOTE — Progress Notes (Signed)
   Subjective:    Patient ID: Christian [name], male    DOB: 04/30/1931, 81 y.o.   MRN: [id number]  Cough This is a new problem. The current episode started 1 to 4 weeks ago (2 weeks). The problem has been gradually worsening. The problem occurs every few hours. The cough is productive of sputum. Pertinent negatives include no ear congestion, ear pain, fever, myalgias, nasal congestion, postnasal drip, rhinorrhea, sore throat, shortness of breath or sweats. Associated symptoms comments: Anterior chest tender to touch off and on  Lightheaded at times. The symptoms are aggravated by lying down (worse at night). Risk factors for lung disease include smoking/tobacco exposure (lives with person who smokes, personal remote history of smoking). He has tried nothing for the symptoms. His past medical history is significant for COPD.     He has also noted  crusty skin behind ears. Most in AM. Also flaky skin at edges of nose and at eyebrows. No itching, not spreading.  present for years.   Review of Systems  Constitutional: Negative for fever.  HENT: Negative for ear pain, postnasal drip, rhinorrhea and sore throat.   Respiratory: Positive for cough. Negative for shortness of breath.   Musculoskeletal: Negative for myalgias.       Objective:   Physical Exam  Constitutional: Vital signs are normal. He appears well-developed and well-nourished.  Non-toxic appearance. He does not appear ill. No distress.  HENT:  Head: Normocephalic and atraumatic.  Right Ear: Hearing, tympanic membrane, external ear and ear canal normal. No tenderness. No foreign bodies. Tympanic membrane is not retracted and not bulging.  Left Ear: Hearing, tympanic membrane, external ear and ear canal normal. No tenderness. No foreign bodies. Tympanic membrane is not retracted and not bulging.  Nose: Nose normal. No mucosal edema or rhinorrhea. Right sinus exhibits no maxillary sinus tenderness and no frontal sinus tenderness. Left  sinus exhibits no maxillary sinus tenderness and no frontal sinus tenderness.  Mouth/Throat: Uvula is midline, oropharynx is clear and moist and mucous membranes are normal. Normal dentition. No dental caries. No oropharyngeal exudate or tonsillar abscesses.  Eyes: Conjunctivae, EOM and lids are normal. Pupils are equal, round, and reactive to light. Lids are everted and swept, no foreign bodies found.  Neck: Trachea normal, normal range of motion and phonation normal. Neck supple. Carotid bruit is not present. No thyroid mass and no thyromegaly present.  Cardiovascular: Normal rate, regular rhythm, S1 normal, S2 normal, normal heart sounds, intact distal pulses and normal pulses.  Exam reveals no gallop.   No murmur heard. Pulmonary/Chest: Effort normal. No respiratory distress. He has no decreased breath sounds. He has no wheezes. He has no rhonchi. He has no rales.  Moist cough  Abdominal: Soft. Normal appearance and bowel sounds are normal. There is no hepatosplenomegaly. There is no tenderness. There is no rebound, no guarding and no CVA tenderness. No hernia.  Neurological: He is alert. He has normal reflexes.  Skin: Skin is warm, dry and intact. Rash noted.  Waxy flaky skin at edges of nose, behind ears and at eyebrows.  Psychiatric: He has a normal mood and affect. His speech is normal and behavior is normal. Judgment normal.          Assessment & Plan:

## 2014-08-22 NOTE — Progress Notes (Signed)
Pre visit review using our clinic review tool, if applicable. No additional management support is needed unless otherwise documented below in the visit note. 

## 2014-08-22 NOTE — Assessment & Plan Note (Signed)
Treat with mucolytic and given > 2 weeks and change in sputum.. Treat with antibiotics.  NO SOB or wheeze needing B agonist or prednisone.

## 2014-08-22 NOTE — Patient Instructions (Addendum)
Complete antibiotics x 5 days. Start mucinex DM twice daily. Apply  Twice daily to rash behind ears and at nose, eyebrow.. Ketoconazole cream.

## 2014-08-22 NOTE — Assessment & Plan Note (Signed)
Treat with topical ketoconazole. 

## 2014-09-14 ENCOUNTER — Other Ambulatory Visit: Payer: Self-pay | Admitting: Cardiovascular Disease

## 2014-09-15 ENCOUNTER — Other Ambulatory Visit: Payer: Self-pay

## 2014-09-29 ENCOUNTER — Other Ambulatory Visit (INDEPENDENT_AMBULATORY_CARE_PROVIDER_SITE_OTHER): Payer: Commercial Managed Care - HMO

## 2014-09-29 ENCOUNTER — Telehealth: Payer: Self-pay | Admitting: Family Medicine

## 2014-09-29 DIAGNOSIS — E7800 Pure hypercholesterolemia, unspecified: Secondary | ICD-10-CM

## 2014-09-29 LAB — COMPREHENSIVE METABOLIC PANEL
ALBUMIN: 3.5 g/dL (ref 3.5–5.2)
ALK PHOS: 68 U/L (ref 39–117)
ALT: 15 U/L (ref 0–53)
AST: 18 U/L (ref 0–37)
BUN: 16 mg/dL (ref 6–23)
CHLORIDE: 107 meq/L (ref 96–112)
CO2: 29 mEq/L (ref 19–32)
Calcium: 9.2 mg/dL (ref 8.4–10.5)
Creatinine, Ser: 1.11 mg/dL (ref 0.40–1.50)
GFR: 67.54 mL/min (ref >60.00)
GLUCOSE: 90 mg/dL (ref 70–99)
Potassium: 4.6 mEq/L (ref 3.5–5.1)
Sodium: 139 mEq/L (ref 135–145)
Total Bilirubin: 0.5 mg/dL (ref 0.2–1.2)
Total Protein: 6.1 g/dL (ref 6.0–8.3)

## 2014-09-29 LAB — LIPID PANEL
CHOL/HDL RATIO: 4
CHOLESTEROL: 166 mg/dL (ref 0–200)
HDL: 41.1 mg/dL (ref >39.00)
LDL Cholesterol: 102 mg/dL — ABNORMAL HIGH (ref 0–99)
NonHDL: 124.9
TRIGLYCERIDES: 113 mg/dL (ref 0.0–149.0)
VLDL: 22.6 mg/dL (ref 0.0–40.0)

## 2014-09-29 NOTE — Telephone Encounter (Signed)
-----   Message from Terri J Walsh sent at 09/20/2014  6:10 PM EDT ----- Regarding: Lab orders for Friday, 6.24.16 Patient is scheduled for CPX labs, please order future labs, Thanks , Terri   

## 2014-10-06 ENCOUNTER — Encounter: Payer: Self-pay | Admitting: Family Medicine

## 2014-10-06 ENCOUNTER — Ambulatory Visit (INDEPENDENT_AMBULATORY_CARE_PROVIDER_SITE_OTHER): Payer: Commercial Managed Care - HMO | Admitting: Family Medicine

## 2014-10-06 VITALS — BP 120/70 | HR 56 | Temp 98.1°F | Ht 63.0 in | Wt 146.0 lb

## 2014-10-06 DIAGNOSIS — Z0000 Encounter for general adult medical examination without abnormal findings: Secondary | ICD-10-CM

## 2014-10-06 DIAGNOSIS — E7800 Pure hypercholesterolemia, unspecified: Secondary | ICD-10-CM

## 2014-10-06 DIAGNOSIS — F320 Major depressive disorder, single episode, mild: Secondary | ICD-10-CM

## 2014-10-06 DIAGNOSIS — Z23 Encounter for immunization: Secondary | ICD-10-CM | POA: Diagnosis not present

## 2014-10-06 DIAGNOSIS — R413 Other amnesia: Secondary | ICD-10-CM

## 2014-10-06 DIAGNOSIS — I10 Essential (primary) hypertension: Secondary | ICD-10-CM

## 2014-10-06 DIAGNOSIS — Z7189 Other specified counseling: Secondary | ICD-10-CM

## 2014-10-06 MED ORDER — ATORVASTATIN CALCIUM 80 MG PO TABS: 80.0000 mg | ORAL_TABLET | Freq: Every day | ORAL | Status: DC

## 2014-10-06 NOTE — Patient Instructions (Addendum)
Increase atorvastatin to 80 mg daily.

## 2014-10-06 NOTE — Progress Notes (Signed)
I have personally reviewed the Medicare Annual Wellness questionnaire and have noted 1. The patient's medical and social history 2. Their use of alcohol, tobacco or illicit drugs 3. Their current medications and supplements 4. The patient's functional ability including ADL's, fall risks, home safety risks and hearing or visual             impairment. 5. Diet and physical activities 6. Evidence for depression or mood disorders 7.         Updated provider list Cognitive evaluation was performed and recorded on pt medicare questionnaire form. The patients weight, height, BMI and visual acuity have been recorded in the chart  I have made referrals, counseling and provided education to the patient based review of the above and I have provided the pt with a written personalized care plan for preventive services.   CAD: s/p CA stent palcement followed by Dr. Mariah MillingGollan.  Hypertension: well controlled.  On lisinopril, coreg. BP Readings from Last 3 Encounters:  10/06/14 120/70  08/22/14 110/60  07/07/14 128/56  Using medication without problems or lightheadedness: None Chest pain with exertion:None Edema:None Short of breath:None Average home BPs: not following Other issues:   Elevated Cholesterol: Not at goal LDL <70 on lipitor 40 mg daily. He reports he was off for short time for few weeks. Lab Results  Component Value Date   CHOL 166 09/29/2014   HDL 41.10 09/29/2014   LDLCALC 102* 09/29/2014   LDLDIRECT 141.4 02/23/2007   TRIG 113.0 09/29/2014   CHOLHDL 4 09/29/2014  Using medications without problems:NOne Muscle aches: None Diet compliance: Moderate Exercise: Walking several miles, [facility] days a week. Other complaints:  Carotid stenosis:followed by cardiologist. Mood.. Depression, mild improved form last OV.  Review of Systems  Constitutional: Negative for fever, fatigue and unexpected weight change.  HENT: Positive for hearing loss. Negative for congestion,  ear pain, postnasal drip, rhinorrhea, sore throat and trouble swallowing.  Eyes: Negative for pain.  Respiratory: Negative for cough, shortness of breath and wheezing.  Cardiovascular: Negative for chest pain, palpitations and leg swelling.  Gastrointestinal: Negative for nausea, abdominal pain, diarrhea, constipation and blood in stool.  Genitourinary: Negative for dysuria, urgency, hematuria, discharge, penile swelling, scrotal swelling, difficulty urinating, penile pain and testicular pain.  Musculoskeletal: Positive for neck pain.  Skin: Negative for rash.  Neurological: Negative for syncope, weakness, light-headedness, numbness and headaches.  Psychiatric/Behavioral: Negative for behavioral problems and dysphoric mood. The patient is not nervous/anxious.   Social History /Family History/Past Medical History reviewed and updated if needed.     Objective:   Physical Exam  Constitutional: He is oriented to person, place, and time. He appears well-developed and well-nourished. Non-toxic appearance. He does not appear ill. No distress.  Appears younger than stated age  HENT:  Head: Normocephalic and atraumatic.  Right Ear: Hearing, external ear and ear canal normal. Tympanic membrane is scarred and perforated.  Left Ear: Hearing, external ear and ear canal normal. Tympanic membrane is scarred and perforated.  Nose: Nose normal.  Mouth/Throat: Uvula is midline, oropharynx is clear and moist and mucous membranes are normal.  Eyes: Conjunctivae, EOM and lids are normal. Pupils are equal, round, and reactive to light. Lids are everted and swept, no foreign bodies found.  Arcus senilus, left lower lid turned out and clear discharge.  Neck: Trachea normal, normal range of motion and phonation normal. Neck supple. Carotid bruit is not present. No mass and no thyromegaly present.  Cardiovascular: Normal rate, regular rhythm,  S1 normal, S2 normal, intact distal pulses and normal pulses.  Exam reveals no gallop.  No murmur heard. Pulmonary/Chest: Breath sounds normal. He has no wheezes. He has no rhonchi. He has no rales.  Abdominal: Soft. Normal appearance and bowel sounds are normal. There is no hepatosplenomegaly. There is no tenderness. There is no rebound, no guarding and no CVA tenderness. No hernia. Hernia confirmed negative in the right inguinal area and confirmed negative in the left inguinal area.  Genitourinary: Prostate normal, testes normal and penis normal. Rectal exam shows no external hemorrhoid, no internal hemorrhoid, no fissure, no mass, no tenderness and anal tone normal. Guaiac negative stool. Prostate is not enlarged and not tender. Right testis shows no mass and no tenderness. Left testis shows no mass and no tenderness. No paraphimosis or penile tenderness.  Musculoskeletal:   Cervical back: He exhibits decreased range of motion, tenderness and bony tenderness. He exhibits no swelling.  No radiculopathy  Lymphadenopathy:   He has no cervical adenopathy.   Right: No inguinal adenopathy present.   Left: No inguinal adenopathy present.  Neurological: He is alert and oriented to person, place, and time. He has normal strength and normal reflexes. No cranial nerve deficit or sensory deficit. Gait normal.  Skin: Skin is warm, dry and intact. No rash noted.  Psychiatric: He has a normal mood and affect. His speech is normal and behavior is normal. Judgment normal.          Assessment & Plan:  The patient's preventative maintenance and recommended screening tests for an annual wellness exam were reviewed in full today. Brought up to date unless services declined.  Counselled on the importance of diet, exercise, and its role in overall health and mortality. The patient's FH and SH was reviewed, including their home life, tobacco status, and drug and alcohol status.   Vaccines: uptodate with tdap, PNA, due for prevnar. No indcation for PSA  or prostate test. Colon cancer screen: no further eval indicated. Nonsmoker.

## 2014-10-06 NOTE — Assessment & Plan Note (Signed)
Inadequate control on current dose statin increase to 80 mg daily atporvastatin. Follow up in 3 months for re-eval.

## 2014-10-06 NOTE — Progress Notes (Signed)
Pre visit review using our clinic review tool, if applicable. No additional management support is needed unless otherwise documented below in the visit note. 

## 2014-10-06 NOTE — Assessment & Plan Note (Signed)
Brief mental status at medicare wellness slightly abnormal. Pt losing keys.  Will eval with labs.  Do full MMSE next OV.

## 2014-10-06 NOTE — Assessment & Plan Note (Signed)
Improving for last OV. Not interested in counseling or med at this time.

## 2014-10-06 NOTE — Assessment & Plan Note (Signed)
Well controlled. Continue current medication.  

## 2014-10-07 LAB — CBC WITH DIFFERENTIAL/PLATELET
BASOS PCT: 1 % (ref 0–1)
Basophils Absolute: 0.1 10*3/uL (ref 0.0–0.1)
Eosinophils Absolute: 0.2 10*3/uL (ref 0.0–0.7)
Eosinophils Relative: 3 % (ref 0–5)
HEMATOCRIT: 38.5 % — AB (ref 39.0–52.0)
Hemoglobin: 13.1 g/dL (ref 13.0–17.0)
LYMPHS ABS: 1.8 10*3/uL (ref 0.7–4.0)
Lymphocytes Relative: 30 % (ref 12–46)
MCH: 33.5 pg (ref 26.0–34.0)
MCHC: 34 g/dL (ref 30.0–36.0)
MCV: 98.5 fL (ref 78.0–100.0)
MONO ABS: 0.8 10*3/uL (ref 0.1–1.0)
MONOS PCT: 13 % — AB (ref 3–12)
MPV: 11.7 fL (ref 8.6–12.4)
Neutro Abs: 3.1 10*3/uL (ref 1.7–7.7)
Neutrophils Relative %: 53 % (ref 43–77)
PLATELETS: 209 10*3/uL (ref 150–400)
RBC: 3.91 MIL/uL — ABNORMAL LOW (ref 4.22–5.81)
RDW: 12.9 % (ref 11.5–15.5)
WBC: 5.9 10*3/uL (ref 4.0–10.5)

## 2014-10-07 LAB — TSH: TSH: 2.357 u[IU]/mL (ref 0.350–4.500)

## 2014-10-07 LAB — VITAMIN B12: Vitamin B-12: 304 pg/mL (ref 211–911)

## 2014-10-07 LAB — VITAMIN D 25 HYDROXY (VIT D DEFICIENCY, FRACTURES): VIT D 25 HYDROXY: 31 ng/mL (ref 30–100)

## 2014-10-10 ENCOUNTER — Encounter: Payer: Self-pay | Admitting: *Deleted

## 2014-12-24 ENCOUNTER — Encounter: Payer: Self-pay | Admitting: Emergency Medicine

## 2014-12-24 ENCOUNTER — Emergency Department: Payer: Commercial Managed Care - HMO

## 2014-12-24 ENCOUNTER — Emergency Department
Admission: EM | Admit: 2014-12-24 | Discharge: 2014-12-24 | Disposition: A | Discharge status: Home / Self Care | Payer: Commercial Managed Care - HMO | Attending: Emergency Medicine | Admitting: Emergency Medicine

## 2014-12-24 DIAGNOSIS — R42 Dizziness and giddiness: Secondary | ICD-10-CM | POA: Insufficient documentation

## 2014-12-24 DIAGNOSIS — Z87891 Personal history of nicotine dependence: Secondary | ICD-10-CM | POA: Insufficient documentation

## 2014-12-24 DIAGNOSIS — R51 Headache: Secondary | ICD-10-CM | POA: Diagnosis present

## 2014-12-24 DIAGNOSIS — Z79899 Other long term (current) drug therapy: Secondary | ICD-10-CM | POA: Diagnosis not present

## 2014-12-24 DIAGNOSIS — I10 Essential (primary) hypertension: Secondary | ICD-10-CM | POA: Diagnosis not present

## 2014-12-24 DIAGNOSIS — Z7982 Long term (current) use of aspirin: Secondary | ICD-10-CM | POA: Diagnosis not present

## 2014-12-24 HISTORY — DX: Acute myocardial infarction, unspecified: I21.9

## 2014-12-24 LAB — CBC WITH DIFFERENTIAL/PLATELET
BASOS ABS: 0 10*3/uL (ref 0–0.1)
Basophils Relative: 1 %
EOS ABS: 0.3 10*3/uL (ref 0–0.7)
Eosinophils Relative: 4 %
HCT: 37.9 % — ABNORMAL LOW (ref 40.0–52.0)
HEMOGLOBIN: 12.8 g/dL — AB (ref 13.0–18.0)
LYMPHS ABS: 2 10*3/uL (ref 1.0–3.6)
Lymphocytes Relative: 30 %
MCH: 33.1 pg (ref 26.0–34.0)
MCHC: 33.7 g/dL (ref 32.0–36.0)
MCV: 98.4 fL (ref 80.0–100.0)
Monocytes Absolute: 1.1 10*3/uL — ABNORMAL HIGH (ref 0.2–1.0)
Monocytes Relative: 16 %
NEUTROS PCT: 49 %
Neutro Abs: 3.4 10*3/uL (ref 1.4–6.5)
Platelets: 189 10*3/uL (ref 150–440)
RBC: 3.85 MIL/uL — AB (ref 4.40–5.90)
RDW: 13.5 % (ref 11.5–14.5)
WBC: 6.9 10*3/uL (ref 3.8–10.6)

## 2014-12-24 LAB — BASIC METABOLIC PANEL
ANION GAP: 6 (ref 5–15)
BUN: 18 mg/dL (ref 6–20)
CHLORIDE: 103 mmol/L (ref 101–111)
CO2: 27 mmol/L (ref 22–32)
Calcium: 9.2 mg/dL (ref 8.9–10.3)
Creatinine, Ser: 1.2 mg/dL (ref 0.61–1.24)
GFR calc non Af Amer: 55 mL/min — ABNORMAL LOW (ref >60)
Glucose, Bld: 123 mg/dL — ABNORMAL HIGH (ref 65–99)
POTASSIUM: 4.4 mmol/L (ref 3.5–5.1)
SODIUM: 136 mmol/L (ref 135–145)

## 2014-12-24 LAB — TROPONIN I

## 2014-12-24 MED ORDER — MECLIZINE HCL 25 MG PO TABS
25.0000 mg | ORAL_TABLET | Freq: Once | ORAL | Status: AC
  Administered 2014-12-24: 25 mg via ORAL
  Filled 2014-12-24: qty 1

## 2014-12-24 MED ORDER — MECLIZINE HCL 25 MG PO TABS: 25.0000 mg | ORAL_TABLET | Freq: Three times a day (TID) | ORAL | Status: DC | PRN

## 2014-12-24 NOTE — ED Notes (Signed)
Pt was brought to triage by pt relations,   Pt reported to traige nurse that he is having a severe head pressure, and he states that he is off balance when he tries to walk- he states he "tries to walk in one direction but goes the other direction"   Triage vitals and screenings bypassed, charge nurse notified and pt taken to CT.  Primary RN was also notified that pt will be coming to room 7 from triage.

## 2014-12-24 NOTE — ED Provider Notes (Signed)
[facility] Emergency Department Provider Note   ____________________________________________  Time seen: 1550  I have reviewed the triage vital signs and the nursing notes.   HISTORY  Chief Complaint No chief complaint on file.   History limited by: Not Limited   HPI Christian [name] is a 81 y.o. male  Who presents to the emergency department today with concerns for head pressure  And difficulty with ambulation. He denies any pain in his head he just states he feels like there is something in his head. He states that these symptoms have been going on and off for the past 3-4 weeks. He states this morning it was now the fifth time he has had this symptom. He states that in terms of his walking he feels like he is drifting off to the left. He denies any chest pain or shortness of breath. Denies any fevers.   Past Medical History  Diagnosis Date  . COPD (chronic obstructive pulmonary disease)   . Hyperlipidemia   . Hypertension   . Carotid stenosis   . GI bleed   . TIA (transient ischemic attack) 2014  . Hearing loss in left ear     Patient Active Problem List   Diagnosis Date Noted  . Memory loss 10/06/2014  . Counseling regarding end of life decision making 10/06/2014  . Depression, major, single episode, mild 10/06/2014  . COPD exacerbation 08/22/2014  . Seborrheic dermatitis 08/22/2014  . Urinary frequency 07/07/2014  . Pain of right great toe 06/13/2014  . Right leg weakness 06/01/2014  . Foot drop, right 06/01/2014  . Acute right lumbar radiculopathy 04/14/2014  . Sensorineural hearing loss of both ears 03/29/2012  . S/P coronary artery stent placement 03/10/2011  . History of lower GI bleeding 03/09/2011  . S/P CABG (coronary artery bypass graft) 03/09/2011  . Chronic neck pain 12/10/2010  . History of gallstones 08/02/2010  . COPD, MILD 05/22/2009  . Carotid stenosis 05/03/2009  . CONSTIPATION 02/02/2009  . DERMATITIS, SEBORRHEIC  04/10/2008  . CAD (coronary artery disease) of artery bypass graft 08/16/2007  . Essential hypertension 02/02/2007  . HYPERCHOLESTEROLEMIA 08/05/2006    Past Surgical History  Procedure Laterality Date  . Appendectomy    . Prostate surgery      nodules benign  . Kidney lithotripsy    . Thumb surgery    . Cholecystectomy    . Coronary artery bypass graft    . Esophagogastroduodenoscopy  03/10/2011    Procedure: ESOPHAGOGASTRODUODENOSCOPY (EGD);  Surgeon: Barrie Folk, MD;  Location: [facility] ENDOSCOPY;  Service: Endoscopy;  Laterality: N/A;    Current Outpatient Rx  Name  Route  Sig  Dispense  Refill  . Acetaminophen (ARTHRITIS PAIN PO)   Oral   Take 2 tablets by mouth daily.         Marland Kitchen aspirin 81 MG tablet   Oral   Take 81 mg by mouth daily.         Marland Kitchen atorvastatin (LIPITOR) 80 MG tablet   Oral   Take 1 tablet (80 mg total) by mouth daily.   90 tablet   3   . carvedilol (COREG) 6.25 MG tablet      TAKE 1 TABLET (6.25 MG TOTAL) BY MOUTH 2 (TWO) TIMES DAILY.   60 tablet   1   . ketoconazole (NIZORAL) 2 % cream   Topical   Apply 1 application topically 2 (two) times daily.   15 g   0   . lisinopril (  PRINIVIL,ZESTRIL) 40 MG tablet      TAKE 1 TABLET (40 MG TOTAL) BY MOUTH DAILY.   30 tablet   1   . tamsulosin (FLOMAX) 0.4 MG CAPS capsule   Oral   Take 1 capsule (0.4 mg total) by mouth daily.   30 capsule   11     Allergies Review of patient's allergies indicates no known allergies.  Family History  Problem Relation Age of Onset  . Heart disease Mother     CAD  . Heart disease Father   . Emphysema Father     Social History Social History  Substance Use Topics  . Smoking status: Former Smoker    Types: Cigarettes    Quit date: 06/10/2001  . Smokeless tobacco: Never Used  . Alcohol Use: No     Comment: no alcohol for 14 years    Review of Systems  Constitutional: Negative for fever. Cardiovascular: Negative for chest pain. Respiratory:  Negative for shortness of breath. Gastrointestinal: Negative for abdominal pain, vomiting and diarrhea. Genitourinary: Negative for dysuria. Musculoskeletal: Negative for back pain. Skin: Negative for rash. Neurological:  Positive for head pressure  10-point ROS otherwise negative.  ____________________________________________   PHYSICAL EXAM:  VITAL SIGNS:   98.3 F (36.8 C)  62  16   192/52 mmHg  99 %     Constitutional: Alert and oriented. Well appearing and in no distress. Eyes: Conjunctivae are normal. PERRL. Normal extraocular movements. ENT   Head: Normocephalic and atraumatic.   Nose: No congestion/rhinnorhea.   Mouth/Throat: Mucous membranes are moist.   Neck: No stridor. Hematological/Lymphatic/Immunilogical: No cervical lymphadenopathy. Cardiovascular: Normal rate, regular rhythm.  No murmurs, rubs, or gallops. Respiratory: Normal respiratory effort without tachypnea nor retractions. Breath sounds are clear and equal bilaterally. No wheezes/rales/rhonchi. Gastrointestinal: Soft and nontender. No distention. Genitourinary: Deferred Musculoskeletal: Normal range of motion in all extremities. No joint effusions.  No lower extremity tenderness nor edema. Neurologic:  Normal speech and language. No gross focal neurologic deficits are appreciated. Speech is normal.  Skin:  Skin is warm, dry and intact. No rash noted. Psychiatric: Mood and affect are normal. Speech and behavior are normal. Patient exhibits appropriate insight and judgment.  ____________________________________________    LABS (pertinent positives/negatives)  Labs Reviewed  CBC WITH DIFFERENTIAL/PLATELET - Abnormal; Notable for the following:    RBC 3.85 (*)    Hemoglobin 12.8 (*)    HCT 37.9 (*)    Monocytes Absolute 1.1 (*)    All other components within normal limits  BASIC METABOLIC PANEL - Abnormal; Notable for the following:    Glucose, Bld 123 (*)    GFR calc non Af Amer 55  (*)    All other components within normal limits  TROPONIN I     ____________________________________________   EKG  I, Phineas Semen, attending physician, personally viewed and interpreted this EKG  EKG Time: 1604 Rate: 63 Rhythm: normal sinus rhythm Axis: normal Intervals: normal QRS: normal ST changes: no st elevation Impression: Normal EKG   ____________________________________________    RADIOLOGY   CT head IMPRESSION: No CT evidence of acute intracranial abnormality.  ____________________________________________   PROCEDURES  Procedure(s) performed: None  Critical Care performed: No  ____________________________________________   INITIAL IMPRESSION / ASSESSMENT AND PLAN / ED COURSE  Pertinent labs & imaging results that were available during my care of the patient were reviewed by me and considered in my medical decision making (see chart for details).   patient presented to the emergency department  today with some concerns for a head pressure as well as some difficulty walking. This has been intermittent. On exam patient without any focal neuro deficits. ED complaining of increased dizziness when I sat him up changed his position. He did get a CT head which did not show any acute intracranial abnormalities. Given the positional dizziness I did give him an Antivert which she stated did help his symptoms. After medication he was able to get up and walk and he stated that he did not feel like he had any difficulties. In addition blood work was done which did not show any concerning etiology for the dizziness. This point I highly doubt an intracranial stroke. Will plan on discharging patient home with meclizine.  ____________________________________________   FINAL CLINICAL IMPRESSION(S) / ED DIAGNOSES  Final diagnoses:  Dizziness     Phineas Semen, MD 12/24/14 1719

## 2014-12-24 NOTE — Discharge Instructions (Signed)
Please seek medical attention for any high fevers, chest pain, shortness of breath, change in behavior, persistent vomiting, bloody stool or any other new or concerning symptoms. ° °Dizziness °Dizziness is a common problem. It is a feeling of unsteadiness or light-headedness. You may feel like you are about to faint. Dizziness can lead to injury if you stumble or fall. A person of any age group can suffer from dizziness, but dizziness is more common in older adults. °CAUSES  °Dizziness can be caused by many different things, including: °· Middle ear problems. °· Standing for too long. °· Infections. °· An allergic reaction. °· Aging. °· An emotional response to something, such as the sight of blood. °· Side effects of medicines. °· Tiredness. °· Problems with circulation or blood pressure. °· Excessive use of alcohol or medicines, or illegal drug use. °· Breathing too fast (hyperventilation). °· An irregular heart rhythm (arrhythmia). °· A low red blood cell count (anemia). °· Pregnancy. °· Vomiting, diarrhea, fever, or other illnesses that cause body fluid loss (dehydration). °· Diseases or conditions such as Parkinson's disease, high blood pressure (hypertension), diabetes, and thyroid problems. °· Exposure to extreme heat. °DIAGNOSIS  °Your health care provider will ask about your symptoms, perform a physical exam, and perform an electrocardiogram (ECG) to record the electrical activity of your heart. Your health care provider may also perform other heart or blood tests to determine the cause of your dizziness. These may include: °· Transthoracic echocardiogram (TTE). During echocardiography, sound waves are used to evaluate how blood flows through your heart. °· Transesophageal echocardiogram (TEE). °· Cardiac monitoring. This allows your health care provider to monitor your heart rate and rhythm in real time. °· Holter monitor. This is a portable device that records your heartbeat and can help diagnose heart  arrhythmias. It allows your health care provider to track your heart activity for several days if needed. °· Stress tests by exercise or by giving medicine that makes the heart beat faster. °TREATMENT  °Treatment of dizziness depends on the cause of your symptoms and can vary greatly. °HOME CARE INSTRUCTIONS  °· Drink enough fluids to keep your urine clear or pale yellow. This is especially important in very hot weather. In older adults, it is also important in cold weather. °· Take your medicine exactly as directed if your dizziness is caused by medicines. When taking blood pressure medicines, it is especially important to get up slowly. °· Rise slowly from chairs and steady yourself until you feel okay. °· In the morning, first sit up on the side of the bed. When you feel okay, stand slowly while holding onto something until you know your balance is fine. °· Move your legs often if you need to stand in one place for a long time. Tighten and relax your muscles in your legs while standing. °· Have someone stay with you for 1-2 days if dizziness continues to be a problem. Do this until you feel you are well enough to stay alone. Have the person call your health care provider if he or she notices changes in you that are concerning. °· Do not drive or use heavy machinery if you feel dizzy. °· Do not drink alcohol. °SEEK IMMEDIATE MEDICAL CARE IF:  °· Your dizziness or light-headedness gets worse. °· You feel nauseous or vomit. °· You have problems talking, walking, or using your arms, hands, or legs. °· You feel weak. °· You are not thinking clearly or you have trouble forming sentences. It may   take a friend or family member to notice this. °· You have chest pain, abdominal pain, shortness of breath, or sweating. °· Your vision changes. °· You notice any bleeding. °· You have side effects from medicine that seems to be getting worse rather than better. °MAKE SURE YOU:  °· Understand these instructions. °· Will watch  your condition. °· Will get help right away if you are not doing well or get worse. °Document Released: 09/17/2000 Document Revised: 03/29/2013 Document Reviewed: 10/11/2010 °[employer]® Patient Information ©2015 [employer]. This information is not intended to replace advice given to you by your health care provider. Make sure you discuss any questions you have with your health care provider. ° ° °

## 2014-12-28 ENCOUNTER — Ambulatory Visit: Payer: Commercial Managed Care - HMO | Admitting: Family Medicine

## 2014-12-29 ENCOUNTER — Encounter: Payer: Self-pay | Admitting: Family Medicine

## 2014-12-29 ENCOUNTER — Ambulatory Visit (INDEPENDENT_AMBULATORY_CARE_PROVIDER_SITE_OTHER): Payer: Commercial Managed Care - HMO | Admitting: Family Medicine

## 2014-12-29 VITALS — BP 134/67 | HR 79 | Temp 98.2°F | Ht 63.0 in | Wt 147.0 lb

## 2014-12-29 DIAGNOSIS — H8110 Benign paroxysmal vertigo, unspecified ear: Secondary | ICD-10-CM

## 2014-12-29 DIAGNOSIS — Z23 Encounter for immunization: Secondary | ICD-10-CM | POA: Diagnosis not present

## 2014-12-29 NOTE — Progress Notes (Signed)
   Subjective:    Patient ID: Christian [name], male    DOB: Aug 28, 1931, 81 y.o.   MRN: [id number]  HPI  81 year old male with CAD  presents following an ER visit on 12/24/2014 for dizziness, head pressure that occurred when he bent over in yard. Felt out of balance.  He had 5 episodes prior to ER visit.  EKG stable. CT head:negative  Labs: BMT and CBC nml  nm neuro exam.   Felt dizziness was positional.. Given antivert for vertigo.  Today pt reports  no further episodes since he left ER. He has been using 3 tabs of meclizine a day. No SE. No confusion.  He has been working out at [employer].  No neurologic symptoms. No weakness, no new numbness.  Review of Systems  Constitutional: Negative for fever and fatigue.  HENT: Negative for ear pain.   Eyes: Negative for pain.  Respiratory: Negative for cough and shortness of breath.   Cardiovascular: Negative for chest pain.       Objective:   Physical Exam  Constitutional: He is oriented to person, place, and time. Vital signs are normal. He appears well-developed and well-nourished.  HENT:  Head: Normocephalic.  Right Ear: Hearing normal.  Left Ear: Hearing normal.  Nose: Nose normal.  Mouth/Throat: Oropharynx is clear and moist and mucous membranes are normal.  Neck: Trachea normal. Carotid bruit is not present. No thyroid mass and no thyromegaly present.  Cardiovascular: Normal rate, regular rhythm and normal pulses.  Exam reveals no gallop, no distant heart sounds and no friction rub.   No murmur heard. No peripheral edema  Pulmonary/Chest: Effort normal and breath sounds normal. No respiratory distress.  Neurological: He is alert and oriented to person, place, and time. He displays atrophy. No cranial nerve deficit or sensory deficit. He exhibits abnormal muscle tone. Gait abnormal. Coordination normal.  nml strength in left lower extremity  Decreased strength in right ankle dorsi and plantar flexion.. Wears brace  Atrophy pf  right leg.  Skin: Skin is warm, dry and intact. No rash noted.  Psychiatric: He has a normal mood and affect. His speech is normal and behavior is normal. Thought content normal.          Assessment & Plan:

## 2014-12-29 NOTE — Progress Notes (Signed)
Pre visit review using our clinic review tool, if applicable. No additional management support is needed unless otherwise documented below in the visit note. 

## 2014-12-29 NOTE — Patient Instructions (Signed)
Decreased meclizine/antivert to use only AS NEEDED three times a day. Start home vertigo exercises.  Call if vertigo episodes not improving as expected.

## 2014-12-29 NOTE — Assessment & Plan Note (Signed)
Info on desensitization exercises given.  Change meclizine to use as needed. Nml neuro exam today and symptoms resolved.

## 2015-01-21 ENCOUNTER — Other Ambulatory Visit: Payer: Self-pay | Admitting: Cardiovascular Disease

## 2015-02-02 ENCOUNTER — Ambulatory Visit (INDEPENDENT_AMBULATORY_CARE_PROVIDER_SITE_OTHER): Payer: Commercial Managed Care - HMO | Admitting: Family Medicine

## 2015-02-02 ENCOUNTER — Encounter: Payer: Self-pay | Admitting: Family Medicine

## 2015-02-02 VITALS — BP 125/67 | HR 54 | Temp 98.3°F | Ht 63.0 in | Wt 150.8 lb

## 2015-02-02 DIAGNOSIS — H8110 Benign paroxysmal vertigo, unspecified ear: Secondary | ICD-10-CM

## 2015-02-02 DIAGNOSIS — H44002 Unspecified purulent endophthalmitis, left eye: Secondary | ICD-10-CM

## 2015-02-02 DIAGNOSIS — H44009 Unspecified purulent endophthalmitis, unspecified eye: Secondary | ICD-10-CM | POA: Insufficient documentation

## 2015-02-02 DIAGNOSIS — R350 Frequency of micturition: Secondary | ICD-10-CM | POA: Diagnosis not present

## 2015-02-02 DIAGNOSIS — H02105 Unspecified ectropion of left lower eyelid: Secondary | ICD-10-CM

## 2015-02-02 MED ORDER — TAMSULOSIN HCL 0.4 MG PO CAPS: 0.4000 mg | ORAL_CAPSULE | Freq: Every day | ORAL | Status: DC

## 2015-02-02 MED ORDER — CARVEDILOL 6.25 MG PO TABS: ORAL_TABLET | ORAL | Status: AC

## 2015-02-02 MED ORDER — ERYTHROMYCIN 5 MG/GM OP OINT: 1.0000 "application " | TOPICAL_OINTMENT | Freq: Every day | OPHTHALMIC | Status: DC

## 2015-02-02 MED ORDER — KETOCONAZOLE 2 % EX CREA: 1.0000 "application " | TOPICAL_CREAM | Freq: Two times a day (BID) | CUTANEOUS | Status: DC

## 2015-02-02 MED ORDER — ATORVASTATIN CALCIUM 80 MG PO TABS: 80.0000 mg | ORAL_TABLET | Freq: Every day | ORAL | Status: AC

## 2015-02-02 NOTE — Patient Instructions (Addendum)
Take meds as listed on  Med sheet.  Stop at front desk for eye doctor referral. Start eye  antibiotic ointment in left eye.  Stay off meclizine.  Call if eye is ont improving and to let me kn ow what eye says. We may consider referral for balnce retraining if balnce is not improving with caring for the eye issue.

## 2015-02-02 NOTE — Assessment & Plan Note (Signed)
Improved with flomax. Refill.

## 2015-02-02 NOTE — Progress Notes (Signed)
Pre visit review using our clinic review tool, if applicable. No additional management support is needed unless otherwise documented below in the visit note. 

## 2015-02-02 NOTE — Progress Notes (Signed)
Subjective:    Patient ID: Christian [name], male    DOB: 11/09/1931, 81 y.o.   MRN: [id number]  HPI  81 year old male presents with continued dizziness as well as several other issues.  At follow up  On 9/23 he had stated symptoms resolved. Told to use meclizine only as needed.  He states that balance is off ,  Walks to the side at times, occurs most often when go sitting to standing. Occuring every few days. He feels it is worse with worse with eye symptoms. He is no longer using meclizine as it is all gone.   Seen in ER in 12/2014 for vertigo. Given meclizine. EKG stable. CT head:negative, Unremarkable appearance of the bilateral orbits. Labs: BMT and CBC nml Nml neuro exam.  Pt appears confused about medication. Not clear what he is taking. BP Readings from Last 3 Encounters:  02/02/15 125/67  12/29/14 134/67  12/24/14 148/62  He has friend with him today. Who is helping. Says he needs several refills.  In last month he has pressure in left eye.  Feels like eye is popping out. Chronic ecttopion on left eye. Has yellowish mucusy discharge x several years, off and on. He has worsening vision in left eye.   No other neuro symptoms.   Flomax helped urinary symptoms.. Needs refill.   Last eye exam. He states he needs a referral to eye MD.  Social History /Family History/Past Medical History reviewed and updated if needed.  Chronic ectropion on left eye.  Review of Systems  Constitutional: Negative for fever and fatigue.  HENT: Negative for ear pain.   Eyes: Negative for pain.  Respiratory: Negative for cough and shortness of breath.   Cardiovascular: Negative for chest pain, palpitations and leg swelling.  Gastrointestinal: Negative for abdominal pain.  Genitourinary: Negative for dysuria.  Musculoskeletal: Negative for arthralgias.  Neurological: Negative for syncope, light-headedness and headaches.  Psychiatric/Behavioral: Negative for dysphoric mood.         Objective:   Physical Exam  Constitutional: He is oriented to person, place, and time. Vital signs are normal. He appears well-developed and well-nourished.  HENT:  Head: Normocephalic.  Right Ear: Hearing normal.  Left Ear: Hearing normal.  Nose: Nose normal.  Mouth/Throat: Oropharynx is clear and moist and mucous membranes are normal.  Eyes: Pupils are equal, round, and reactive to light. Right eye exhibits no discharge, no exudate and no hordeolum. No foreign body present in the right eye. Left eye exhibits discharge and exudate. No foreign body present in the left eye. Right conjunctiva is not injected. Left conjunctiva is injected. Left conjunctiva has no hemorrhage. Right eye exhibits normal extraocular motion. Left eye exhibits normal extraocular motion and no nystagmus. Pupils are equal.    Ectropion left lower lid  Neck: Trachea normal. Carotid bruit is not present. No thyroid mass and no thyromegaly present.  Cardiovascular: Normal rate, regular rhythm and normal pulses.  Exam reveals no gallop, no distant heart sounds and no friction rub.   No murmur heard. No peripheral edema  Pulmonary/Chest: Effort normal and breath sounds normal. No respiratory distress.  Neurological: He is alert and oriented to person, place, and time. He displays atrophy. No cranial nerve deficit or sensory deficit. He exhibits abnormal muscle tone. Gait abnormal. Coordination normal. GCS eye subscore is 4. GCS verbal subscore is 5. GCS motor subscore is 6.  nml strength in left lower extremity  Decreased strength in right ankle dorsi and plantar flexion.. Wears  brace  Atrophy pf right leg.  Skin: Skin is warm, dry and intact. No rash noted.  Psychiatric: He has a normal mood and affect. His speech is normal and behavior is normal. Thought content normal.          Assessment & Plan:

## 2015-02-02 NOTE — Assessment & Plan Note (Signed)
Sensation of eye pressure and falling out likly due to this chronic issue.  refer to optho for treatment recommendations. Unclear if contributing to balance issues, given decrease in vision in left eye and  Possible affect on depth perception.

## 2015-02-02 NOTE — Assessment & Plan Note (Signed)
Left eyelid red, conjunctiva red.. ? Bacterial superinfeciton.. Treat with erytthromycin topical.

## 2015-02-02 NOTE — Assessment & Plan Note (Signed)
No clear new neuro symtpoms.  ? If related to eye symtpoms.  Stop meclizine as high risk for sedating SE.  If not improving consider referral to ENT or for balance retraining.

## 2015-02-10 ENCOUNTER — Encounter: Payer: Self-pay | Admitting: Emergency Medicine

## 2015-02-10 ENCOUNTER — Emergency Department: Payer: Commercial Managed Care - HMO

## 2015-02-10 ENCOUNTER — Emergency Department
Admission: EM | Admit: 2015-02-10 | Discharge: 2015-02-10 | Disposition: A | Discharge status: Home / Self Care | Payer: Commercial Managed Care - HMO | Attending: Emergency Medicine | Admitting: Emergency Medicine

## 2015-02-10 DIAGNOSIS — Z792 Long term (current) use of antibiotics: Secondary | ICD-10-CM | POA: Insufficient documentation

## 2015-02-10 DIAGNOSIS — Z79899 Other long term (current) drug therapy: Secondary | ICD-10-CM | POA: Diagnosis not present

## 2015-02-10 DIAGNOSIS — Z87891 Personal history of nicotine dependence: Secondary | ICD-10-CM | POA: Insufficient documentation

## 2015-02-10 DIAGNOSIS — J449 Chronic obstructive pulmonary disease, unspecified: Secondary | ICD-10-CM | POA: Diagnosis not present

## 2015-02-10 DIAGNOSIS — Z7982 Long term (current) use of aspirin: Secondary | ICD-10-CM | POA: Insufficient documentation

## 2015-02-10 DIAGNOSIS — I10 Essential (primary) hypertension: Secondary | ICD-10-CM | POA: Insufficient documentation

## 2015-02-10 DIAGNOSIS — R079 Chest pain, unspecified: Secondary | ICD-10-CM

## 2015-02-10 LAB — CBC
HCT: 37.5 % — ABNORMAL LOW (ref 40.0–52.0)
HEMOGLOBIN: 12.6 g/dL — AB (ref 13.0–18.0)
MCH: 32.9 pg (ref 26.0–34.0)
MCHC: 33.6 g/dL (ref 32.0–36.0)
MCV: 97.9 fL (ref 80.0–100.0)
Platelets: 185 10*3/uL (ref 150–440)
RBC: 3.83 MIL/uL — AB (ref 4.40–5.90)
RDW: 12.9 % (ref 11.5–14.5)
WBC: 5.7 10*3/uL (ref 3.8–10.6)

## 2015-02-10 LAB — BASIC METABOLIC PANEL
ANION GAP: 4 — AB (ref 5–15)
BUN: 22 mg/dL — ABNORMAL HIGH (ref 6–20)
CALCIUM: 8.8 mg/dL — AB (ref 8.9–10.3)
CO2: 27 mmol/L (ref 22–32)
CREATININE: 1.2 mg/dL (ref 0.61–1.24)
Chloride: 107 mmol/L (ref 101–111)
GFR calc non Af Amer: 55 mL/min — ABNORMAL LOW (ref >60)
Glucose, Bld: 135 mg/dL — ABNORMAL HIGH (ref 65–99)
Potassium: 4.2 mmol/L (ref 3.5–5.1)
SODIUM: 138 mmol/L (ref 135–145)

## 2015-02-10 LAB — TROPONIN I

## 2015-02-10 NOTE — ED Notes (Signed)
Reviewed d/c instructions and follow-up care with pt and family.  Pt/family verbalized understanding.

## 2015-02-10 NOTE — ED Notes (Signed)
Pt reports chest pain that started 2 weeks ago, reports left sided chest pain. Pt denies shortness of breath, nausea or vomiting; reports frequent cough, yellow phlegm.

## 2015-02-10 NOTE — ED Provider Notes (Signed)
[facility] Emergency Department Provider Note  ____________________________________________    I have reviewed the triage vital signs and the nursing notes.   HISTORY  Chief Complaint Chest Pain    HPI Christian [name] is a 81 y.o. male who presents with complaints of left-sided anterior chest discomfort that started 2 weeks ago and has been constant. He reports it was worse today. He denies shortness of breath, no diaphoresis no nausea or vomiting. He has had a mild cough. He does have a history of coronary artery disease including a quadruple bypass. He reports compliance with medications and sees Dr. Mariah MillingGollan of cardiology regularly. He reports the pain is typically nagging but today became moderate and he became concerned. In the emergency room and he reports the pain has improved significantly without treatment. He denies lower extremity pain or swelling, no recent travel    Past Medical History  Diagnosis Date  . COPD (chronic obstructive pulmonary disease) (HCC)   . Hyperlipidemia   . Hypertension   . Carotid stenosis   . GI bleed   . TIA (transient ischemic attack) 2014  . Hearing loss in left ear   . MI (myocardial infarction) [facility](HCC)     Patient Active Problem List   Diagnosis Date Noted  . 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 Eye infection 02/02/2015  . Benign paroxysmal positional vertigo 12/29/2014  . Memory loss 10/06/2014  . Counseling regarding end of life decision making 10/06/2014  . Depression, major, single episode, mild (HCC) 10/06/2014  . COPD exacerbation (HCC) 08/22/2014  . Seborrheic dermatitis 08/22/2014  . Urinary frequency 07/07/2014  . Right leg weakness 06/01/2014  . Foot drop, right 06/01/2014  . Acute right lumbar radiculopathy 04/14/2014  . Sensorineural hearing loss of both ears 03/29/2012  . S/P coronary artery stent placement 03/10/2011  . History of lower GI bleeding 03/09/2011  . S/P CABG (coronary  artery bypass graft) 03/09/2011  . Chronic neck pain 12/10/2010  . History of gallstones 08/02/2010  . COPD, MILD 05/22/2009  . Carotid stenosis 05/03/2009  . CONSTIPATION 02/02/2009  . DERMATITIS, SEBORRHEIC 04/10/2008  . CAD (coronary artery disease) of artery bypass graft 08/16/2007  . Essential hypertension 02/02/2007  . HYPERCHOLESTEROLEMIA 08/05/2006    Past Surgical History  Procedure Laterality Date  . Appendectomy    . Prostate surgery      nodules benign  . Kidney lithotripsy    . Thumb surgery    . Cholecystectomy    . Coronary artery bypass graft    . Esophagogastroduodenoscopy  03/10/2011    Procedure: ESOPHAGOGASTRODUODENOSCOPY (EGD);  Surgeon: Barrie FolkJohn C Hayes, MD;  Location: [facility] ENDOSCOPY;  Service: Endoscopy;  Laterality: N/A;    Current Outpatient Rx  Name  Route  Sig  Dispense  Refill  . Acetaminophen (ARTHRITIS PAIN PO)   Oral   Take 2 tablets by mouth daily.         Marland Kitchen. aspirin 81 MG tablet   Oral   Take 81 mg by mouth daily.         Marland Kitchen. atorvastatin (LIPITOR) 80 MG tablet   Oral   Take 1 tablet (80 mg total) by mouth daily.   90 tablet   3   . carvedilol (COREG) 6.25 MG tablet      TAKE 1 TABLET (6.25 MG TOTAL) BY MOUTH 2 (TWO) TIMES DAILY.   180 tablet   3   . erythromycin ophthalmic ointment   Left Eye   Place 1  application into the left eye at bedtime.   3.5 g   0   . ketoconazole (NIZORAL) 2 % cream   Topical   Apply 1 application topically 2 (two) times daily.   15 g   0   . lisinopril (PRINIVIL,ZESTRIL) 40 MG tablet      TAKE 1 TABLET (40 MG TOTAL) BY MOUTH DAILY.   30 tablet   1   . tamsulosin (FLOMAX) 0.4 MG CAPS capsule   Oral   Take 1 capsule (0.4 mg total) by mouth daily.   90 capsule   3     Allergies Review of patient's allergies indicates no known allergies.  Family History  Problem Relation Age of Onset  . Heart disease Mother     CAD  . Heart disease Father   . Emphysema Father     Social  History Social History  Substance Use Topics  . Smoking status: Former Smoker    Types: Cigarettes    Quit date: 06/10/2001  . Smokeless tobacco: Never Used  . Alcohol Use: No     Comment: no alcohol for 14 years    Review of Systems  Constitutional: Negative for fever. Eyes: Negative for visual changes. ENT: Negative for sore throat Cardiovascular: Chest discomfort Respiratory: Negative for shortness of breath. Gastrointestinal: Negative for abdominal pain, vomiting and diarrhea. Genitourinary: Negative for dysuria. Musculoskeletal: Negative for back pain. Skin: Negative for rash. Neurological: Negative for headaches or focal weakness Psychiatric: No anxiety    ____________________________________________   PHYSICAL EXAM:  VITAL SIGNS: ED Triage Vitals  Enc Vitals Group     BP 02/10/15 1653 143/63 mmHg     Pulse Rate 02/10/15 1653 66     Resp 02/10/15 1653 18     Temp 02/10/15 1653 98.4 F (36.9 C)     Temp src --      SpO2 02/10/15 1653 99 %     Weight 02/10/15 1653 147 lb (66.679 kg)     Height 02/10/15 1653  (1.6 m)     Head Cir --      Peak Flow --      Pain Score 02/10/15 1700 8     Pain Loc --      Pain Edu? --      Excl. in GC? --      Constitutional: Alert and oriented. Well appearing and in no distress. Eyes: Conjunctivae are normal.  ENT   Head: Normocephalic and atraumatic.   Mouth/Throat: Mucous membranes are moist. Cardiovascular: Normal rate, regular rhythm. Normal and symmetric distal pulses are present in all extremities. No murmurs, rubs, or gallops. Respiratory: Normal respiratory effort without tachypnea nor retractions. Breath sounds are clear and equal bilaterally.  Gastrointestinal: Soft and non-tender in all quadrants. No distention. There is no CVA tenderness. Genitourinary: deferred Musculoskeletal: Nontender with normal range of motion in all extremities. No lower extremity tenderness nor edema. Mild tenderness to  palpation in the left lateral anterior chest wall Neurologic:  Normal speech and language. No gross focal neurologic deficits are appreciated. Skin:  Skin is warm, dry and intact. No rash noted. Psychiatric: Mood and affect are normal. Patient exhibits appropriate insight and judgment.  ____________________________________________    LABS (pertinent positives/negatives)  Labs Reviewed  BASIC METABOLIC PANEL - Abnormal; Notable for the following:    Glucose, Bld 135 (*)    BUN 22 (*)    Calcium 8.8 (*)    GFR calc non Af Amer 55 (*)  Anion gap 4 (*)    All other components within normal limits  CBC - Abnormal; Notable for the following:    RBC 3.83 (*)    Hemoglobin 12.6 (*)    HCT 37.5 (*)    All other components within normal limits  TROPONIN I  TROPONIN I    ____________________________________________   EKG  ED ECG REPORT I, Jene Every, the attending physician, personally viewed and interpreted this ECG.  Date: 02/10/2015 EKG Time: 4:46 PM Rate: 67 Rhythm: normal sinus rhythm QRS Axis: normal Intervals: normal ST/T Wave abnormalities: normal Conduction Disutrbances: none Narrative Interpretation: unremarkable   ____________________________________________    RADIOLOGY I have personally reviewed any xrays that were ordered on this patient: Chest x-ray unremarkable   ____________________________________________   PROCEDURES  Procedure(s) performed: none  Critical Care performed: none  ____________________________________________   INITIAL IMPRESSION / ASSESSMENT AND PLAN / ED COURSE  Pertinent labs & imaging results that were available during my care of the patient were reviewed by me and considered in my medical decision making (see chart for details).  Patient with strong history of coronary artery disease presents with 2 weeks of chest discomfort in the left anterior chest. He does have tenderness to palpation at the site. He reports the  pain is not related to exertion, denies shortness of breath, denies pleurisy. Initial troponin is normal. EKG is reassuring. He is compliant with his medications. Vital signs are unremarkable. He does not want to stay in the hospital but given his history his presentation is concerning. He does agree to have a second troponin   Second troponin is normal and patient reports he is feeling better. He is quite convinced that his pain is related to using clippers out in the lawn. He is again unwilling to stay in the hospital I will discharge him with close follow-up with cardiology and instructions to return if any return of his pain ____________________________________________   FINAL CLINICAL IMPRESSION(S) / ED DIAGNOSES  Final diagnoses:  Chest pain, unspecified chest pain type     Jene Every, MD 02/10/15 2309

## 2015-02-10 NOTE — Discharge Instructions (Signed)
Nonspecific Chest Pain  °Chest pain can be caused by many different conditions. There is always a chance that your pain could be related to something serious, such as a heart attack or a blood clot in your lungs. Chest pain can also be caused by conditions that are not life-threatening. If you have chest pain, it is very important to follow up with your health care provider. °CAUSES  °Chest pain can be caused by: °· Heartburn. °· Pneumonia or bronchitis. °· Anxiety or stress. °· Inflammation around your heart (pericarditis) or lung (pleuritis or pleurisy). °· A blood clot in your lung. °· A collapsed lung (pneumothorax). It can develop suddenly on its own (spontaneous pneumothorax) or from trauma to the chest. °· Shingles infection (varicella-zoster virus). °· Heart attack. °· Damage to the bones, muscles, and cartilage that make up your chest wall. This can include: °¨ Bruised bones due to injury. °¨ Strained muscles or cartilage due to frequent or repeated coughing or overwork. °¨ Fracture to one or more ribs. °¨ Sore cartilage due to inflammation (costochondritis). °RISK FACTORS  °Risk factors for chest pain may include: °· Activities that increase your risk for trauma or injury to your chest. °· Respiratory infections or conditions that cause frequent coughing. °· Medical conditions or overeating that can cause heartburn. °· Heart disease or family history of heart disease. °· Conditions or health behaviors that increase your risk of developing a blood clot. °· Having had chicken pox (varicella zoster). °SIGNS AND SYMPTOMS °Chest pain can feel like: °· Burning or tingling on the surface of your chest or deep in your chest. °· Crushing, pressure, aching, or squeezing pain. °· Dull or sharp pain that is worse when you move, cough, or take a deep breath. °· Pain that is also felt in your back, neck, shoulder, or arm, or pain that spreads to any of these areas. °Your chest pain may come and go, or it may stay  constant. °DIAGNOSIS °Lab tests or other studies may be needed to find the cause of your pain. Your health care provider may have you take a test called an ambulatory ECG (electrocardiogram). An ECG records your heartbeat patterns at the time the test is performed. You may also have other tests, such as: °· Transthoracic echocardiogram (TTE). During echocardiography, sound waves are used to create a picture of all of the heart structures and to look at how blood flows through your heart. °· Transesophageal echocardiogram (TEE). This is a more advanced imaging test that obtains images from inside your body. It allows your health care provider to see your heart in finer detail. °· Cardiac monitoring. This allows your health care provider to monitor your heart rate and rhythm in real time. °· Holter monitor. This is a portable device that records your heartbeat and can help to diagnose abnormal heartbeats. It allows your health care provider to track your heart activity for several days, if needed. °· Stress tests. These can be done through exercise or by taking medicine that makes your heart beat more quickly. °· Blood tests. °· Imaging tests. °TREATMENT  °Your treatment depends on what is causing your chest pain. Treatment may include: °· Medicines. These may include: °¨ Acid blockers for heartburn. °¨ Anti-inflammatory medicine. °¨ Pain medicine for inflammatory conditions. °¨ Antibiotic medicine, if an infection is present. °¨ Medicines to dissolve blood clots. °¨ Medicines to treat coronary artery disease. °· Supportive care for conditions that do not require medicines. This may include: °¨ Resting. °¨ Applying heat   or cold packs to injured areas. °¨ Limiting activities until pain decreases. °HOME CARE INSTRUCTIONS °· If you were prescribed an antibiotic medicine, finish it all even if you start to feel better. °· Avoid any activities that bring on chest pain. °· Do not use any tobacco products, including  cigarettes, chewing tobacco, or electronic cigarettes. If you need help quitting, ask your health care provider. °· Do not drink alcohol. °· Take medicines only as directed by your health care provider. °· Keep all follow-up visits as directed by your health care provider. This is important. This includes any further testing if your chest pain does not go away. °· If heartburn is the cause for your chest pain, you may be told to keep your head raised (elevated) while sleeping. This reduces the chance that acid will go from your stomach into your esophagus. °· Make lifestyle changes as directed by your health care provider. These may include: °¨ Getting regular exercise. Ask your health care provider to suggest some activities that are safe for you. °¨ Eating a heart-healthy diet. A registered dietitian can help you to learn healthy eating options. °¨ Maintaining a healthy weight. °¨ Managing diabetes, if necessary. °¨ Reducing stress. °SEEK MEDICAL CARE IF: °· Your chest pain does not go away after treatment. °· You have a rash with blisters on your chest. °· You have a fever. °SEEK IMMEDIATE MEDICAL CARE IF:  °· Your chest pain is worse. °· You have an increasing cough, or you cough up blood. °· You have severe abdominal pain. °· You have severe weakness. °· You faint. °· You have chills. °· You have sudden, unexplained chest discomfort. °· You have sudden, unexplained discomfort in your arms, back, neck, or jaw. °· You have shortness of breath at any time. °· You suddenly start to sweat, or your skin gets clammy. °· You feel nauseous or you vomit. °· You suddenly feel light-headed or dizzy. °· Your heart begins to beat quickly, or it feels like it is skipping beats. °These symptoms may represent a serious problem that is an emergency. Do not wait to see if the symptoms will go away. Get medical help right away. Call your local emergency services (911 in the [place] Do not drive yourself to the hospital. °  °This  information is not intended to replace advice given to you by your health care provider. Make sure you discuss any questions you have with your health care provider. °  °Document Released: 01/01/2005 Document Revised: 04/14/2014 Document Reviewed: 10/28/2013 °Elsevier Interactive Patient Education ©2016 [employer] ° °

## 2015-02-20 ENCOUNTER — Ambulatory Visit (INDEPENDENT_AMBULATORY_CARE_PROVIDER_SITE_OTHER): Payer: Commercial Managed Care - HMO | Admitting: Family Medicine

## 2015-02-20 ENCOUNTER — Encounter: Payer: Self-pay | Admitting: Family Medicine

## 2015-02-20 VITALS — BP 101/51 | HR 54 | Temp 97.5°F | Ht 63.0 in | Wt 152.5 lb

## 2015-02-20 DIAGNOSIS — R0789 Other chest pain: Secondary | ICD-10-CM | POA: Diagnosis not present

## 2015-02-20 DIAGNOSIS — R031 Nonspecific low blood-pressure reading: Secondary | ICD-10-CM

## 2015-02-20 DIAGNOSIS — J441 Chronic obstructive pulmonary disease with (acute) exacerbation: Secondary | ICD-10-CM

## 2015-02-20 MED ORDER — TAMSULOSIN HCL 0.4 MG PO CAPS: 0.4000 mg | ORAL_CAPSULE | Freq: Every day | ORAL | Status: DC

## 2015-02-20 MED ORDER — AZITHROMYCIN 250 MG PO TABS
ORAL_TABLET | ORAL | Status: DC
Start: 2015-02-20 — End: 2015-02-21

## 2015-02-20 NOTE — Progress Notes (Signed)
Subjective:    Patient ID: Christian [name], male    DOB: 04/17/1931, 81 y.o.   MRN: [id number]  HPI  81 year old male with history of  CAD S/P quadruple bypass, COPD, mild, carotid stenosis HTn and high cholesterol presents for pre-op clearance for surgery on his left eye for ectropion.  CXR unremarkable.Recent ER visit on 11/5 for chest pain.  EKG and labs unremarkable, r/o troponin. Pt was quite convinced that his pain is related to using clippers out in the lawn.   He still has some chest wall tenderness, rib pain. Pain has been constant but seems to be improving with time.   Followed by Dr. Mariah MillingGollan Cardiology. Has appt  On 11/16.  He feels lightheaded today Acting sluggish in the office, unlike himself. On coreg and lisinopril. Took today. BP Readings from Last 3 Encounters:  02/20/15 101/51  02/10/15 169/79  02/02/15 125/67   He currently has a bad cold x 4-5 days, nose running, coughing a lot, some wheeze, some shortness of breath. More tired lately. Feels woozy today.  No fever measured. Using OTC  Cold med.Marland Kitchen. Helps.   Review of Systems  Constitutional: Positive for fatigue. Negative for fever and chills.  HENT: Negative for ear pain and sore throat.   Eyes: Negative for pain and redness.  Respiratory: Positive for cough, shortness of breath and wheezing.   Cardiovascular: Positive for chest pain. Negative for palpitations and leg swelling.  Gastrointestinal: Negative for abdominal pain.       Objective:   Physical Exam  Constitutional: Vital signs are normal. He appears well-developed and well-nourished. He appears lethargic.  Non-toxic appearance. No distress.  HENT:  Head: Normocephalic and atraumatic.  Right Ear: Hearing, tympanic membrane, external ear and ear canal normal. No tenderness. No foreign bodies. Tympanic membrane is not retracted and not bulging.  Left Ear: Hearing, tympanic membrane, external ear and ear canal normal. No tenderness. No foreign bodies.  Tympanic membrane is not retracted and not bulging.  Nose: Nose normal. No mucosal edema or rhinorrhea. Right sinus exhibits no maxillary sinus tenderness and no frontal sinus tenderness. Left sinus exhibits no maxillary sinus tenderness and no frontal sinus tenderness.  Mouth/Throat: Uvula is midline, oropharynx is clear and moist and mucous membranes are normal. Normal dentition. No dental caries. No oropharyngeal exudate or tonsillar abscesses.  Eyes: Conjunctivae, EOM and lids are normal. Pupils are equal, round, and reactive to light. Lids are everted and swept, no foreign bodies found.  Neck: Trachea normal, normal range of motion and phonation normal. Neck supple. Carotid bruit is not present. No thyroid mass and no thyromegaly present.  Cardiovascular: Regular rhythm, S1 normal, S2 normal, intact distal pulses and normal pulses.  Bradycardia present.  Exam reveals distant heart sounds. Exam reveals no gallop.   No murmur heard. Pulmonary/Chest: Effort normal and breath sounds normal. No respiratory distress. He has no decreased breath sounds. He has no wheezes. He has no rhonchi. He has no rales.  Mild ttp over left lower rib cage  Abdominal: Soft. Normal appearance and bowel sounds are normal. There is no hepatosplenomegaly. There is no tenderness. There is no rebound, no guarding and no CVA tenderness. No hernia.  Neurological: He has normal reflexes. He appears lethargic.  Skin: Skin is warm, dry and intact. No rash noted.  Psychiatric: He has a normal mood and affect. His speech is normal and behavior is normal. Judgment normal.          Assessment &  Plan:

## 2015-02-20 NOTE — Assessment & Plan Note (Addendum)
No tachycardia suggesting sepsis.  Orthostatics  Were negative.  Encouraged pt to drink fluids.  Will have repeat BP check at cardiology tommorow.

## 2015-02-20 NOTE — Assessment & Plan Note (Signed)
Secondary to muscle strain/rib injury. Improving now.

## 2015-02-20 NOTE — Progress Notes (Signed)
Pre visit review using our clinic review tool, if applicable. No additional management support is needed unless otherwise documented below in the visit note. 

## 2015-02-20 NOTE — Patient Instructions (Addendum)
Complete azithromycin course.  Can use mucinex DM for the cough twice daily.  Drink lots of water.  Keep appt with cardiology appointment  [facility] for cardiac clearance for possible eye surgery.  Follow up in 1 week for recheck respiratory status. CALL IF NOT FEELING BETTER IN 48 HOURS!

## 2015-02-20 NOTE — Assessment & Plan Note (Signed)
Treat with  Antibiotics and mucolytic.

## 2015-02-21 ENCOUNTER — Encounter: Payer: Self-pay | Admitting: Cardiovascular Disease

## 2015-02-21 ENCOUNTER — Ambulatory Visit (INDEPENDENT_AMBULATORY_CARE_PROVIDER_SITE_OTHER): Payer: Commercial Managed Care - HMO | Admitting: Cardiovascular Disease

## 2015-02-21 VITALS — BP 130/60 | HR 67 | Ht 63.0 in | Wt 150.2 lb

## 2015-02-21 DIAGNOSIS — J441 Chronic obstructive pulmonary disease with (acute) exacerbation: Secondary | ICD-10-CM

## 2015-02-21 DIAGNOSIS — I25810 Atherosclerosis of coronary artery bypass graft(s) without angina pectoris: Secondary | ICD-10-CM

## 2015-02-21 DIAGNOSIS — I6521 Occlusion and stenosis of right carotid artery: Secondary | ICD-10-CM

## 2015-02-21 DIAGNOSIS — R0789 Other chest pain: Secondary | ICD-10-CM

## 2015-02-21 DIAGNOSIS — I10 Essential (primary) hypertension: Secondary | ICD-10-CM | POA: Diagnosis not present

## 2015-02-21 DIAGNOSIS — Z951 Presence of aortocoronary bypass graft: Secondary | ICD-10-CM

## 2015-02-21 DIAGNOSIS — E7800 Pure hypercholesterolemia, unspecified: Secondary | ICD-10-CM

## 2015-02-21 NOTE — Progress Notes (Signed)
Patient ID: Christian [name], male    DOB: [birth year], 81 y.o.   MRN: [id number]  HPI Comments: Christian [name] is a very pleasant 53 -year-old male with history of coronary artery disease, status post bypass surgery in 2009.  He has a normal ejection fraction.  PMH also notable for HTN, HL, carotid stenosis and COPD.  He stopped smoking 16 years ago  Carotid u/s showing R 60-79% L 59% stable On a previous office visit, he was having a carotid ultrasound, head dizziness and was sent to the emergency room.  He was kept overnight for observation. Workup was essentially normal.  Echocardiogram showed ejection fraction greater than 55%, EKG and MRI of the head showed small vessel disease, no acute stroke.  Carotid ultrasound showed greater than 50% carotid disease on the right.  He presents today for follow-up of his coronary artery disease, PAD  He reports having some left-sided chest pain, likely from using the clippers in his garden. Tenderness on palpation. He did go to the emergency room and evaluation did not show any acute findings concerning for acute ischemia Current he feels well my exercises on a regular basis with no chest pain symptoms Denies any leg edema, no shortness of breath on exertion No recent episodes of dizziness, spinning  EKG on today's visit shows normal sinus rhythm with rate 67 bpm, no significant ST or T-wave changes   2012, gallstones with chronic pain requiring lap chole.  Previous dizziness and profound anemia in 2012, vomiting blood found to have an ulcer with hematocrit of 22. He required transfusion of several units.  chronic neck pain.     Admitted to [facility] in February 2012,  Underwent cath which Showed LAD T LCX OM-2 long 80% RCA totalled proximally. SVG-PDA was patent. LIMA patent. SVG-OM chronically occluded. Underwent PCI with Xience DES to OM-2.   Admitted  to [facility] with CP in 2013 after eating an onion. ECG and CE normal.    EKG shows normal sinus rhythm with rate  53 beats a minute, no significant ST or T wave changes Total cholesterol 161, LDL 98, this was done 6 months ago  Total cholesterol 136  No Known Allergies  Current Outpatient Prescriptions on File Prior to Visit  Medication Sig Dispense Refill  . Acetaminophen (ARTHRITIS PAIN PO) Take 2 tablets by mouth daily.    Marland Kitchen aspirin 81 MG tablet Take 81 mg by mouth daily.    Marland Kitchen atorvastatin (LIPITOR) 80 MG tablet Take 1 tablet (80 mg total) by mouth daily. 90 tablet 3  . carvedilol (COREG) 6.25 MG tablet TAKE 1 TABLET (6.25 MG TOTAL) BY MOUTH 2 (TWO) TIMES DAILY. 180 tablet 3  . erythromycin ophthalmic ointment Place 1 application into the left eye at bedtime. 3.5 g 0  . ketoconazole (NIZORAL) 2 % cream Apply 1 application topically 2 (two) times daily. 15 g 0  . lisinopril (PRINIVIL,ZESTRIL) 40 MG tablet TAKE 1 TABLET (40 MG TOTAL) BY MOUTH DAILY. 30 tablet 1   No current facility-administered medications on file prior to visit.    Past Medical History  Diagnosis Date  . COPD (chronic obstructive pulmonary disease) (HCC)   . Hyperlipidemia   . Hypertension   . Carotid stenosis   . GI bleed   . TIA (transient ischemic attack) 2014  . Hearing loss in left ear   . MI (myocardial infarction) [facility])     Past Surgical History  Procedure Laterality Date  . Appendectomy    .  Prostate surgery      nodules benign  . Kidney lithotripsy    . Thumb surgery    . Cholecystectomy    . Coronary artery bypass graft    . Esophagogastroduodenoscopy  03/10/2011    Procedure: ESOPHAGOGASTRODUODENOSCOPY (EGD);  Surgeon: Barrie FolkJohn C Hayes, MD;  [facility];  Service: Endoscopy;  Laterality: N/A;    Social History  reports that he quit smoking about 13 years ago. His smoking use included Cigarettes. He has never used smokeless tobacco. He reports that he does not drink alcohol or use illicit drugs.  Family History family history includes Emphysema in his father; Heart disease in his father and  mother.   Review of Systems  Constitutional: Negative.   Respiratory: Negative.   Cardiovascular: Negative.   Gastrointestinal: Negative.   Musculoskeletal: Negative.   Neurological: Negative.   Hematological: Negative.   Psychiatric/Behavioral: Negative.   All other systems reviewed and are negative.   BP 130/60 mmHg  Pulse 67  Ht 5\' 3"  (1.6 m)  Wt 150 lb 4 oz (68.153 kg)  BMI 26.62 kg/m2  Physical Exam  Constitutional: He is oriented to person, place, and time. He appears well-developed and well-nourished.  HENT:  Head: Normocephalic.  Nose: Nose normal.  Mouth/Throat: Oropharynx is clear and moist.  Eyes: Conjunctivae are normal. Pupils are equal, round, and reactive to light.  Neck: Normal range of motion. Neck supple. No JVD present.  Cardiovascular: Normal rate, regular rhythm, S1 normal, S2 normal, normal heart sounds and intact distal pulses.  Exam reveals no gallop and no friction rub.   No murmur heard. Pulmonary/Chest: Effort normal and breath sounds normal. No respiratory distress. He has no wheezes. He has no rales. He exhibits no tenderness.  Abdominal: Soft. Bowel sounds are normal. He exhibits no distension. There is no tenderness.  Musculoskeletal: Normal range of motion. He exhibits no edema or tenderness.  Lymphadenopathy:    He has no cervical adenopathy.  Neurological: He is alert and oriented to person, place, and time. Coordination normal.  Skin: Skin is warm and dry. No rash noted. No erythema.  Psychiatric: He has a normal mood and affect. His behavior is normal. Judgment and thought content normal.      Assessment and Plan   Nursing note and vitals reviewed.

## 2015-02-21 NOTE — Assessment & Plan Note (Signed)
Repeat carotid ultrasound ordered. 2.5 years ago, had 60% disease on the right

## 2015-02-21 NOTE — Assessment & Plan Note (Signed)
Recent left chest pain reproducible with palpation consistent with muscular skeletal injury Recommended rest and slowly getting back into his workout regiment

## 2015-02-21 NOTE — Assessment & Plan Note (Signed)
Currently with no symptoms of angina. No further workup at this time Cholesterol above goal. This was discussed with him in detail

## 2015-02-21 NOTE — Assessment & Plan Note (Signed)
Currently with no symptoms of angina. No further workup at this time. Continue current medication regimen. 

## 2015-02-21 NOTE — Patient Instructions (Addendum)
You are doing well. No medication changes were made.  We will schedule a carotid ultrasound for known stenosis on the right  Date & time:_________________________________  Please call us if you have new issues that need to be addressed before your next appt.  Your physician wants you to follow-up in: 6 months.  You will receive a reminder letter in the mail two months in advance. If you don't receive a letter, please call our office to schedule the follow-up appointment.  Vascular Ultrasound An ultrasound, also called sonography or ultrasonography, uses harmless sound waves to take pictures of the inside of your body. The pictures are taken with a device called a transducer that is held up against your body. The continually changing pictures can be recorded on videotape or film. A vascular ultrasound is a painless test to see if you have blood flow problems or clots in your blood vessels. It may be done to look at blood vessels almost anywhere in the body. There are several types of ultrasounds that can be done to look at the blood vessels. They include:  Continuous wave Doppler ultrasound. This type of ultrasound uses the change in pitch of sound waves to provide information about blood flow through a blood vessel. During the test, a health care provider listens to the sounds produced by the transducer.  Duplex ultrasound. This type of ultrasound uses standard ultrasound methods to produce a picture of a blood vessel and surrounding organs. In addition, a computer provides information about the speed and direction of blood flow through the blood vessel. With this type of ultrasound it is possible to see the structures inside the body and to evaluate blood flow within those structures at the same time.  Color Doppler ultrasound. This type of ultrasound uses standard ultrasound methods to produce a picture of a blood vessel. In addition, a computer converts the Doppler sounds into colors that are  overlaid on the picture of the blood vessel. These colors represent the speed and direction of blood flow through the vessel.  Power Doppler ultrasound. This type of ultrasound is up to five times more sensitive than color Doppler ultrasound. Power Doppler ultrasound can also get pictures that are difficult or impossible to get using standard color Doppler ultrasound. Power Doppler ultrasound is most commonly used to evaluate blood flow through vessels within organs, such as the liver or kidneys.  Transcranial Doppler ultrasound. This type of ultrasound looks at blood flow in blood vessels throughout the brain. It can reveal the presence of narrow arteries, clots blocking the vessels, or malformed blood vessels. RISKS AND COMPLICATIONS There are no known risks or complications of having an ultrasound. BEFORE THE PROCEDURE  If the ultrasound scan involves your upper abdomen, you may be directed not to eat, smoke, or chew gum the morning of your exam. Follow your health care provider's instructions.  During the test, a gel will be applied to your skin. Wear clothing that is easily washable in case the gel gets on your clothes. PROCEDURE  A gel will be applied to your skin. It may feel cool.  The transducer will be placed on the area to be examined.  Pictures will be taken. They will be displayed on one or more monitors that look like small television screens. AFTER THE PROCEDURE  You can safely drive home and return to regular activities immediately after your exam.  Keep follow-up visits as directed by your health care provider.  Ask when your test results will be  ready. It is your responsibility to get your test results.   This information is not intended to replace advice given to you by your health care provider. Make sure you discuss any questions you have with your health care provider.   Document Released: 04/04/2004 Document Revised: 04/14/2014 Document Reviewed:  06/16/2013 Elsevier Interactive Patient Education [employer]

## 2015-02-21 NOTE — Assessment & Plan Note (Signed)
He denies any recent shortness of breath episodes. Active at the gym with no complaints

## 2015-02-21 NOTE — Assessment & Plan Note (Signed)
LDL above goal. Stressed the importance of compliance on his Lipitor Total cholesterol has been slowly trending up over the past several years

## 2015-02-21 NOTE — Assessment & Plan Note (Signed)
Blood pressure is well controlled on today's visit. No changes made to the medications. 

## 2015-02-27 ENCOUNTER — Ambulatory Visit (INDEPENDENT_AMBULATORY_CARE_PROVIDER_SITE_OTHER): Payer: Commercial Managed Care - HMO | Admitting: Family Medicine

## 2015-02-27 ENCOUNTER — Encounter: Payer: Self-pay | Admitting: Family Medicine

## 2015-02-27 VITALS — BP 116/47 | HR 55 | Temp 97.4°F | Ht 63.0 in | Wt 151.5 lb

## 2015-02-27 DIAGNOSIS — Z01818 Encounter for other preprocedural examination: Secondary | ICD-10-CM | POA: Diagnosis not present

## 2015-02-27 DIAGNOSIS — J441 Chronic obstructive pulmonary disease with (acute) exacerbation: Secondary | ICD-10-CM | POA: Diagnosis not present

## 2015-02-27 NOTE — Progress Notes (Signed)
   Subjective:    Patient ID: Christian [name], male    DOB: 01/27/1932, 81 y.o.   MRN: [id number]  HPI  81 year old male presents for 1 week follow up of COPD exacerbation.  At last OV on 11/15 seen with cough, congesiton, shortness of breath. As well as low blood pressure.  Started on  antibiotics and mucolytic.   Today the pt reports he feels well. His cough is better, no fever. Less dizzy.. Only had 10 min spell of dizziness last night. No SOB, no wheeze.  Chest wall pain improved.   BP Readings from Last 3 Encounters:  02/27/15 116/47  02/21/15 130/60  02/20/15 101/51    He has upcoming eye surgery and has been deeemed stable by Cardiology Dr. Mariah MillingGollan on 11/16.    Review of Systems  Constitutional: Negative for fever and fatigue.  HENT: Negative for ear pain.   Eyes: Negative for pain.  Respiratory: Negative for cough and shortness of breath.   Cardiovascular: Negative for chest pain, palpitations and leg swelling.       Objective:   Physical Exam  Constitutional: Vital signs are normal. He appears well-developed and well-nourished.  HENT:  Head: Normocephalic.  Right Ear: Hearing normal.  Left Ear: Hearing normal.  Nose: Nose normal.  Mouth/Throat: Oropharynx is clear and moist and mucous membranes are normal.  Neck: Trachea normal. Carotid bruit is not present. No thyroid mass and no thyromegaly present.  Cardiovascular: Normal rate, regular rhythm and normal pulses.  Exam reveals no gallop, no distant heart sounds and no friction rub.   No murmur heard. No peripheral edema  Pulmonary/Chest: Effort normal and breath sounds normal. No respiratory distress.  Skin: Skin is warm, dry and intact. No rash noted.  Psychiatric: He has a normal mood and affect. His speech is normal and behavior is normal. Thought content normal.          Assessment & Plan:

## 2015-02-27 NOTE — Progress Notes (Signed)
Pre visit review using our clinic review tool, if applicable. No additional management support is needed unless otherwise documented below in the visit note. 

## 2015-02-27 NOTE — Assessment & Plan Note (Signed)
Cleared from cardiac ( per Dr. Mariah MillingGollan) and pulmonary status for minor eye surgery.

## 2015-02-27 NOTE — Patient Instructions (Signed)
Christian [name] is cleared for eye surgery from both a cardiac and pulmonary standpoint. Please call with any uestions.

## 2015-03-20 ENCOUNTER — Ambulatory Visit: Payer: Commercial Managed Care - HMO

## 2015-03-20 DIAGNOSIS — I6521 Occlusion and stenosis of right carotid artery: Secondary | ICD-10-CM | POA: Diagnosis not present

## 2015-03-20 DIAGNOSIS — I25810 Atherosclerosis of coronary artery bypass graft(s) without angina pectoris: Secondary | ICD-10-CM | POA: Diagnosis not present

## 2015-03-20 DIAGNOSIS — I10 Essential (primary) hypertension: Secondary | ICD-10-CM

## 2015-03-27 ENCOUNTER — Observation Stay
Admission: EM | Admit: 2015-03-27 | Discharge: 2015-03-29 | Disposition: A | Discharge status: Home / Self Care | Payer: Commercial Managed Care - HMO | Attending: Internal Medicine | Admitting: Internal Medicine

## 2015-03-27 ENCOUNTER — Emergency Department: Payer: Commercial Managed Care - HMO

## 2015-03-27 DIAGNOSIS — I6529 Occlusion and stenosis of unspecified carotid artery: Secondary | ICD-10-CM | POA: Diagnosis not present

## 2015-03-27 DIAGNOSIS — W19XXXA Unspecified fall, initial encounter: Secondary | ICD-10-CM | POA: Insufficient documentation

## 2015-03-27 DIAGNOSIS — H02105 Unspecified ectropion of left lower eyelid: Secondary | ICD-10-CM | POA: Diagnosis not present

## 2015-03-27 DIAGNOSIS — R51 Headache: Secondary | ICD-10-CM | POA: Insufficient documentation

## 2015-03-27 DIAGNOSIS — N390 Urinary tract infection, site not specified: Secondary | ICD-10-CM | POA: Diagnosis not present

## 2015-03-27 DIAGNOSIS — E785 Hyperlipidemia, unspecified: Secondary | ICD-10-CM | POA: Insufficient documentation

## 2015-03-27 DIAGNOSIS — H9190 Unspecified hearing loss, unspecified ear: Secondary | ICD-10-CM | POA: Insufficient documentation

## 2015-03-27 DIAGNOSIS — Z951 Presence of aortocoronary bypass graft: Secondary | ICD-10-CM | POA: Diagnosis not present

## 2015-03-27 DIAGNOSIS — Z79899 Other long term (current) drug therapy: Secondary | ICD-10-CM | POA: Diagnosis not present

## 2015-03-27 DIAGNOSIS — E860 Dehydration: Secondary | ICD-10-CM | POA: Insufficient documentation

## 2015-03-27 DIAGNOSIS — J9811 Atelectasis: Secondary | ICD-10-CM | POA: Insufficient documentation

## 2015-03-27 DIAGNOSIS — F419 Anxiety disorder, unspecified: Secondary | ICD-10-CM | POA: Diagnosis not present

## 2015-03-27 DIAGNOSIS — R42 Dizziness and giddiness: Secondary | ICD-10-CM | POA: Diagnosis not present

## 2015-03-27 DIAGNOSIS — I119 Hypertensive heart disease without heart failure: Secondary | ICD-10-CM | POA: Diagnosis not present

## 2015-03-27 DIAGNOSIS — Z8673 Personal history of transient ischemic attack (TIA), and cerebral infarction without residual deficits: Secondary | ICD-10-CM | POA: Diagnosis not present

## 2015-03-27 DIAGNOSIS — J449 Chronic obstructive pulmonary disease, unspecified: Secondary | ICD-10-CM | POA: Diagnosis not present

## 2015-03-27 DIAGNOSIS — I252 Old myocardial infarction: Secondary | ICD-10-CM | POA: Insufficient documentation

## 2015-03-27 DIAGNOSIS — Z7982 Long term (current) use of aspirin: Secondary | ICD-10-CM | POA: Diagnosis not present

## 2015-03-27 DIAGNOSIS — Z87891 Personal history of nicotine dependence: Secondary | ICD-10-CM | POA: Diagnosis not present

## 2015-03-27 DIAGNOSIS — R531 Weakness: Secondary | ICD-10-CM | POA: Diagnosis not present

## 2015-03-27 DIAGNOSIS — G319 Degenerative disease of nervous system, unspecified: Secondary | ICD-10-CM | POA: Diagnosis not present

## 2015-03-27 DIAGNOSIS — I2510 Atherosclerotic heart disease of native coronary artery without angina pectoris: Secondary | ICD-10-CM | POA: Diagnosis not present

## 2015-03-27 DIAGNOSIS — R079 Chest pain, unspecified: Secondary | ICD-10-CM | POA: Insufficient documentation

## 2015-03-27 MED ORDER — ONDANSETRON HCL 4 MG/2ML IJ SOLN
4.0000 mg | Freq: Once | INTRAMUSCULAR | Status: AC
  Administered 2015-03-28: 4 mg via INTRAVENOUS
  Filled 2015-03-27: qty 2

## 2015-03-27 MED ORDER — SODIUM CHLORIDE 0.9 % IV BOLUS (SEPSIS)
500.0000 mL | Freq: Once | INTRAVENOUS | Status: AC
  Administered 2015-03-28: 500 mL via INTRAVENOUS

## 2015-03-27 MED ORDER — DIAZEPAM 5 MG/ML IJ SOLN
1.0000 mg | Freq: Once | INTRAMUSCULAR | Status: AC
  Administered 2015-03-28: 1 mg via INTRAVENOUS
  Filled 2015-03-27: qty 2

## 2015-03-27 NOTE — ED Notes (Signed)
Patient transported to CT 

## 2015-03-27 NOTE — ED Provider Notes (Signed)
[facility] Emergency Department Provider Note  ____________________________________________  Time seen: Approximately 11:13 PM  I have reviewed the triage vital signs and the nursing notes.   HISTORY  Chief Complaint Dizziness    HPI Christian [name] is a 81 y.o. male who presents to the ED via EMS from home with a chief complaint of dizziness. Patient describes sudden onset of room spinning prior to arrival associated with nausea. Symptoms are worse with head movement. Patient reports recent fall 2 nights ago. States his "feet didn't work", he walked backwards for an unknown reason, and fell onto his tailbone. States he subsequently struck his head. Denies use of anticoagulants. Does state he has had a history of similar symptoms 4-5 times over the past 6 months. Currently patient is complaining of "heartburn". He has a history of CAD s/p CABG. Denies recent fever, chills, vision changes, shortness of breath, abdominal pain, diarrhea, numbness, tingling, focal weakness. States he has been seeing the ophthalmologist for IV related issues over the past several months. Nothing makes the symptoms better. Movement makes the symptoms worse.   Past Medical History  Diagnosis Date  . COPD (chronic obstructive pulmonary disease) (HCC)   . Hyperlipidemia   . Hypertension   . Carotid stenosis   . GI bleed   . TIA (transient ischemic attack) 2014  . Hearing loss in left ear   . MI (myocardial infarction) [facility])     Patient Active Problem List   Diagnosis Date Noted  . Pre-op examination 02/27/2015  . 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 Benign paroxysmal positional vertigo 12/29/2014  . Memory loss 10/06/2014  . Counseling regarding end of life decision making 10/06/2014  . Depression, major, single episode, mild (HCC) 10/06/2014  . COPD exacerbation (HCC) 08/22/2014  . Seborrheic dermatitis 08/22/2014  . Urinary frequency 07/07/2014  . Right leg weakness  06/01/2014  . Foot drop, right 06/01/2014  . Acute right lumbar radiculopathy 04/14/2014  . Sensorineural hearing loss of both ears 03/29/2012  . S/P coronary artery stent placement 03/10/2011  . History of lower GI bleeding 03/09/2011  . S/P CABG (coronary artery bypass graft) 03/09/2011  . Chronic neck pain 12/10/2010  . History of gallstones 08/02/2010  . COPD, MILD 05/22/2009  . Carotid stenosis 05/03/2009  . CONSTIPATION 02/02/2009  . DERMATITIS, SEBORRHEIC 04/10/2008  . CAD (coronary artery disease) of artery bypass graft 08/16/2007  . Essential hypertension 02/02/2007  . HYPERCHOLESTEROLEMIA 08/05/2006    Past Surgical History  Procedure Laterality Date  . Appendectomy    . Prostate surgery      nodules benign  . Kidney lithotripsy    . Thumb surgery    . Cholecystectomy    . Coronary artery bypass graft    . Esophagogastroduodenoscopy  03/10/2011    Procedure: ESOPHAGOGASTRODUODENOSCOPY (EGD);  Surgeon: Barrie Folk, MD;  Location: [facility] ENDOSCOPY;  Service: Endoscopy;  Laterality: N/A;    Current Outpatient Rx  Name  Route  Sig  Dispense  Refill  . Acetaminophen (ARTHRITIS PAIN PO)   Oral   Take 2 tablets by mouth daily.         Marland Kitchen aspirin 81 MG tablet   Oral   Take 81 mg by mouth daily.         Marland Kitchen atorvastatin (LIPITOR) 80 MG tablet   Oral   Take 1 tablet (80 mg total) by mouth daily.   90 tablet   3   . carvedilol (COREG) 6.25 MG tablet  TAKE 1 TABLET (6.25 MG TOTAL) BY MOUTH 2 (TWO) TIMES DAILY.   180 tablet   3   . erythromycin ophthalmic ointment   Left Eye   Place 1 application into the left eye at bedtime.   3.5 g   0   . ketoconazole (NIZORAL) 2 % cream   Topical   Apply 1 application topically 2 (two) times daily.   15 g   0   . lisinopril (PRINIVIL,ZESTRIL) 40 MG tablet      TAKE 1 TABLET (40 MG TOTAL) BY MOUTH DAILY.   30 tablet   1     Allergies Review of patient's allergies indicates no known allergies.  Family  History  Problem Relation Age of Onset  . Heart disease Mother     CAD  . Heart disease Father   . Emphysema Father     Social History Social History  Substance Use Topics  . Smoking status: Former Smoker    Types: Cigarettes    Quit date: 06/10/2001  . Smokeless tobacco: Never Used  . Alcohol Use: No     Comment: no alcohol for 14 years    Review of Systems Constitutional: No fever/chills Eyes: No visual changes. ENT: No sore throat. Cardiovascular: Positive for chest pain. Respiratory: Denies shortness of breath. Gastrointestinal: No abdominal pain.  Positive for nausea, no vomiting.  No diarrhea.  No constipation. Genitourinary: Negative for dysuria. Musculoskeletal: Negative for back pain. Skin: Negative for rash. Neurological: Positive for dizziness. Negative for headaches, focal weakness or numbness.  10-point ROS otherwise negative.  ____________________________________________   PHYSICAL EXAM:  VITAL SIGNS: ED Triage Vitals  Enc Vitals Group     BP 03/27/15 2301 168/78 mmHg     Pulse Rate 03/27/15 2301 61     Resp 03/27/15 2301 16     Temp --      Temp src --      SpO2 03/27/15 2301 100 %     Weight 03/27/15 2301 134 lb (60.782 kg)     Height 03/27/15 2301 5\' 3"  (1.6 m)     Head Cir --      Peak Flow --      Pain Score 03/27/15 2304 0     Pain Loc --      Pain Edu? --      Excl. in GC? --     Constitutional: Alert and oriented. Well appearing and in mild acute distress. Eyes: Conjunctivae are normal. PERRL. EOMI. Left lower eyelid ectropion. Head: Atraumatic. Ears: Within normal limits. Nose: No congestion/rhinnorhea. Mouth/Throat: Mucous membranes are moist.  Oropharynx non-erythematous. Neck: No stridor. No carotid bruits. Cardiovascular: Normal rate, regular rhythm. Grossly normal heart sounds.  Good peripheral circulation. Respiratory: Normal respiratory effort.  No retractions. Lungs CTAB. Gastrointestinal: Soft and nontender. No  distention. No abdominal bruits. No CVA tenderness. Musculoskeletal: No lower extremity tenderness nor edema.  No joint effusions. Neurologic:  Alert and oriented to person and place. Occasional confusion. Normal speech and language. No gross focal neurologic deficits are appreciated.  Skin:  Skin is warm, dry and intact. No rash noted. Psychiatric: Mood and affect are normal. Speech and behavior are normal.  ____________________________________________   LABS (all labs ordered are listed, but only abnormal results are displayed)  Labs Reviewed  CBC WITH DIFFERENTIAL/PLATELET - Abnormal; Notable for the following:    RBC 3.98 (*)    HCT 38.5 (*)    All other components within normal limits  COMPREHENSIVE METABOLIC PANEL -  Abnormal; Notable for the following:    Glucose, Bld 106 (*)    BUN 21 (*)    Calcium 8.8 (*)    Albumin 3.4 (*)    GFR calc non Af Amer 59 (*)    All other components within normal limits  TROPONIN I  LACTIC ACID, PLASMA  LACTIC ACID, PLASMA  URINALYSIS COMPLETEWITH MICROSCOPIC ([facility] ONLY)   ____________________________________________  EKG  ED ECG REPORT I, Makynlee Kressin J, the attending physician, personally viewed and interpreted this ECG.   Date: 03/27/2015  EKG Time: 2257  Rate: 64  Rhythm: normal EKG, normal sinus rhythm  Axis: Normal  Intervals:none  ST&T Change: Nonspecific  ____________________________________________  RADIOLOGY  Portable chest x-ray (viewed by me, interpreted per Dr. Karie Kirks): No acute cardiopulmonary process. Stable LEFT lung base atelectasis/ scarring.  CT head without contrast interpreted per Dr. Pecolia Ades: Stable age related cerebral atrophy, ventriculomegaly and periventricular white matter disease.  No new/ acute intracranial findings or mass lesions. ____________________________________________   PROCEDURES  Procedure(s) performed: None  Critical Care performed:  No  ____________________________________________   INITIAL IMPRESSION / ASSESSMENT AND PLAN / ED COURSE  Pertinent labs & imaging results that were available during my care of the patient were reviewed by me and considered in my medical decision making (see chart for details).  81 year old male with a history of CAD s/p CABG, hypertension, carotid stenosis, TIA who presents with complaints of dizziness and "heartburn". Patient's occasional confusion and cerebellar complaints leading to fall 2 nights ago are concerning for posterior CVA. Will check screening lab work, obtain CT head, administer IV Valium and Zofran for symptomatic relief of dizziness and reassess.  ----------------------------------------- 1:14 AM on 03/28/2015 -----------------------------------------  Patient slightly improved; however, became dizzy on sitting up. Head CT negative for intracranial hemorrhage, will administer aspirin for chest discomfort. Discussed with hospitalist for admission. ____________________________________________   FINAL CLINICAL IMPRESSION(S) / ED DIAGNOSES  Final diagnoses:  Dizziness  Chest pain, unspecified chest pain type      Irean Hong, MD 03/28/15 [phone]

## 2015-03-27 NOTE — ED Notes (Signed)
Pt presents to ED via [facility] w/ c/o dizziness. Pt reports h/x of the same x 4-5 over the last 6 months. EMS reports pt states no h/x of vertigo, but has medications for dizziness but out at this time. Per EMS VS are:  BP 178/84; HR 68; O2 sats 98% on RA; CBG 111.  Pt is A&O, in NAD, with respirations regular, even, and unlabored. Pt denies any N/V/D or pain, but currently with c/o heartburn.

## 2015-03-28 DIAGNOSIS — R42 Dizziness and giddiness: Secondary | ICD-10-CM

## 2015-03-28 DIAGNOSIS — H02106 Unspecified ectropion of left eye, unspecified eyelid: Secondary | ICD-10-CM | POA: Insufficient documentation

## 2015-03-28 LAB — CBC WITH DIFFERENTIAL/PLATELET
BASOS ABS: 0.1 10*3/uL (ref 0–0.1)
Basophils Relative: 1 %
EOS ABS: 0.4 10*3/uL (ref 0–0.7)
Eosinophils Relative: 5 %
HCT: 38.5 % — ABNORMAL LOW (ref 40.0–52.0)
HEMOGLOBIN: 13 g/dL (ref 13.0–18.0)
LYMPHS ABS: 2.3 10*3/uL (ref 1.0–3.6)
Lymphocytes Relative: 29 %
MCH: 32.8 pg (ref 26.0–34.0)
MCHC: 33.9 g/dL (ref 32.0–36.0)
MCV: 96.7 fL (ref 80.0–100.0)
Monocytes Absolute: 1 10*3/uL (ref 0.2–1.0)
Monocytes Relative: 13 %
NEUTROS PCT: 52 %
Neutro Abs: 4.1 10*3/uL (ref 1.4–6.5)
Platelets: 186 10*3/uL (ref 150–440)
RBC: 3.98 MIL/uL — AB (ref 4.40–5.90)
RDW: 12.4 % (ref 11.5–14.5)
WBC: 7.9 10*3/uL (ref 3.8–10.6)

## 2015-03-28 LAB — COMPREHENSIVE METABOLIC PANEL
ALBUMIN: 3.4 g/dL — AB (ref 3.5–5.0)
ALK PHOS: 86 U/L (ref 38–126)
ALT: 17 U/L (ref 17–63)
AST: 21 U/L (ref 15–41)
Anion gap: 7 (ref 5–15)
BUN: 21 mg/dL — AB (ref 6–20)
CALCIUM: 8.8 mg/dL — AB (ref 8.9–10.3)
CO2: 23 mmol/L (ref 22–32)
CREATININE: 1.13 mg/dL (ref 0.61–1.24)
Chloride: 107 mmol/L (ref 101–111)
GFR calc non Af Amer: 59 mL/min — ABNORMAL LOW (ref >60)
GLUCOSE: 106 mg/dL — AB (ref 65–99)
Potassium: 4.2 mmol/L (ref 3.5–5.1)
SODIUM: 137 mmol/L (ref 135–145)
Total Bilirubin: 0.5 mg/dL (ref 0.3–1.2)
Total Protein: 6.5 g/dL (ref 6.5–8.1)

## 2015-03-28 LAB — URINALYSIS COMPLETE WITH MICROSCOPIC (ARMC ONLY)
Bacteria, UA: NONE SEEN
Bilirubin Urine: NEGATIVE
Glucose, UA: NEGATIVE mg/dL
Hgb urine dipstick: NEGATIVE
Ketones, ur: NEGATIVE mg/dL
Nitrite: NEGATIVE
Protein, ur: NEGATIVE mg/dL
Specific Gravity, Urine: 1.02 (ref 1.005–1.030)
pH: 5 (ref 5.0–8.0)

## 2015-03-28 LAB — TROPONIN I: Troponin I: <0.03 ng/mL (ref <0.031)

## 2015-03-28 LAB — HEMOGLOBIN A1C: Hgb A1c MFr Bld: 5.2 % (ref 4.0–6.0)

## 2015-03-28 LAB — TSH: TSH: 4.337 u[IU]/mL (ref 0.350–4.500)

## 2015-03-28 LAB — LACTIC ACID, PLASMA: Lactic Acid, Venous: 1.8 mmol/L (ref 0.5–2.0)

## 2015-03-28 MED ORDER — DOCUSATE SODIUM 100 MG PO CAPS
100.0000 mg | ORAL_CAPSULE | Freq: Two times a day (BID) | ORAL | Status: DC
  Administered 2015-03-28 – 2015-03-29 (×3): 100 mg via ORAL
  Filled 2015-03-28 (×3): qty 1

## 2015-03-28 MED ORDER — ATORVASTATIN CALCIUM 20 MG PO TABS
80.0000 mg | ORAL_TABLET | Freq: Every day | ORAL | Status: DC
  Administered 2015-03-28 – 2015-03-29 (×2): 80 mg via ORAL
  Filled 2015-03-28 (×2): qty 4

## 2015-03-28 MED ORDER — ONDANSETRON HCL 4 MG/2ML IJ SOLN: 4.0000 mg | Freq: Four times a day (QID) | INTRAMUSCULAR | Status: DC | PRN

## 2015-03-28 MED ORDER — LISINOPRIL 20 MG PO TABS
40.0000 mg | ORAL_TABLET | Freq: Every day | ORAL | Status: DC
  Administered 2015-03-28 – 2015-03-29 (×2): 40 mg via ORAL
  Filled 2015-03-28 (×2): qty 2

## 2015-03-28 MED ORDER — ACETAMINOPHEN 650 MG RE SUPP
650.0000 mg | Freq: Four times a day (QID) | RECTAL | Status: DC | PRN
Start: 2015-03-28 — End: 2015-03-29

## 2015-03-28 MED ORDER — ASPIRIN 325 MG PO TABS
325.0000 mg | ORAL_TABLET | Freq: Every day | ORAL | Status: DC
  Administered 2015-03-28 – 2015-03-29 (×2): 325 mg via ORAL
  Filled 2015-03-28 (×2): qty 1

## 2015-03-28 MED ORDER — CARVEDILOL 6.25 MG PO TABS
6.2500 mg | ORAL_TABLET | Freq: Two times a day (BID) | ORAL | Status: DC
  Administered 2015-03-28 (×2): 6.25 mg via ORAL
  Filled 2015-03-28 (×2): qty 1

## 2015-03-28 MED ORDER — ONDANSETRON HCL 4 MG PO TABS: 4.0000 mg | ORAL_TABLET | Freq: Four times a day (QID) | ORAL | Status: DC | PRN

## 2015-03-28 MED ORDER — MECLIZINE HCL 12.5 MG PO TABS
12.5000 mg | ORAL_TABLET | Freq: Three times a day (TID) | ORAL | Status: DC | PRN
  Filled 2015-03-28: qty 1

## 2015-03-28 MED ORDER — MORPHINE SULFATE (PF) 2 MG/ML IV SOLN: 2.0000 mg | INTRAVENOUS | Status: DC | PRN

## 2015-03-28 MED ORDER — ASPIRIN 81 MG PO CHEW
324.0000 mg | CHEWABLE_TABLET | Freq: Once | ORAL | Status: AC
  Administered 2015-03-28: 324 mg via ORAL
  Filled 2015-03-28: qty 4

## 2015-03-28 MED ORDER — ACETAMINOPHEN 325 MG PO TABS: 650.0000 mg | ORAL_TABLET | Freq: Four times a day (QID) | ORAL | Status: DC | PRN

## 2015-03-28 MED ORDER — HEPARIN SODIUM (PORCINE) 5000 UNIT/ML IJ SOLN
5000.0000 [IU] | Freq: Three times a day (TID) | INTRAMUSCULAR | Status: DC
  Administered 2015-03-28 – 2015-03-29 (×4): 5000 [IU] via SUBCUTANEOUS
  Filled 2015-03-28 (×4): qty 1

## 2015-03-28 MED ORDER — SODIUM CHLORIDE 0.9 % IJ SOLN
3.0000 mL | Freq: Two times a day (BID) | INTRAMUSCULAR | Status: DC
  Administered 2015-03-28: 3 mL via INTRAVENOUS

## 2015-03-28 MED ORDER — DEXTROSE 5 % IV SOLN
1.0000 g | INTRAVENOUS | Status: DC
  Administered 2015-03-28 – 2015-03-29 (×2): 1 g via INTRAVENOUS
  Filled 2015-03-28 (×3): qty 10

## 2015-03-28 MED ORDER — SODIUM CHLORIDE 0.9 % IV SOLN
INTRAVENOUS | Status: DC
  Administered 2015-03-28 – 2015-03-29 (×2): via INTRAVENOUS

## 2015-03-28 NOTE — Progress Notes (Addendum)
Patient has been admitted with vertigo after a falling episode. Patient is alert, follows commands, and is hard of hearing. Patient is able to transfer from bed to chair with 1 person assistance. Patient tolerated PT well and cooperative with nursing staff.

## 2015-03-28 NOTE — Evaluation (Signed)
Physical Therapy Evaluation Patient Details Name: Christian [name] MRN: [id number] DOB: Sep 20, 1931 Today's Date: 03/28/2015   History of Present Illness  Pt is an 81 y.o. male presenting to hospital with dizziness and extreme weakness.  Pt with recent fall backward onto tailbone and reports he jarred his back and hurt his head.  Pt admitted to hospital with vertigo and severe anxiety.  PMH includes COPD, htn, GI bleed, TIA, hearing loss L ear, MI, carotid stenosis, CABG.  Clinical Impression  Prior to admission, pt was independent (occasionally using SPC for ambulation).  Pt lives with a friend in 1 level home with stairs to enter.  Currently pt is SBA with bed mobility and CGA with transfers and gait with RW (pt requesting 2 assist d/t concerns of falling d/t dizziness).  Pt's friend (that he lives with) came during session and pt appears to be a poor historian based on information obtained from friend (pt does not appear to be at baseline cognitive status).  Pt would benefit from skilled PT to address noted impairments and functional limitations.  Recommend pt discharge to home with 24/7 assist when medically appropriate.     Follow Up Recommendations Home health PT;Supervision/Assistance - 24 hour    Equipment Recommendations  Rolling walker with 5" wheels    Recommendations for Other Services       Precautions / Restrictions Precautions Precautions: Fall Restrictions Weight Bearing Restrictions: No      Mobility  Bed Mobility Overal bed mobility: Needs Assistance Bed Mobility: Supine to Sit     Supine to sit: Supervision;HOB elevated     General bed mobility comments: mild increased time to perform  Transfers Overall transfer level: Needs assistance Equipment used: Rolling walker (2 wheeled) Transfers: Sit to/from Stand Sit to Stand: Min guard         General transfer comment: strong stand with RW without loss of balance  Ambulation/Gait Ambulation/Gait  assistance: Min guard;+2 safety/equipment (pt requesting 2 assist d/t concerns for falling)   Assistive device: Rolling walker (2 wheeled)   Gait velocity: decreased   General Gait Details: decreased B step length/foot clearance/heelstrike; initial shuffling type gait but improved with distance  Stairs            Wheelchair Mobility    Modified Rankin (Stroke Patients Only)       Balance Overall balance assessment: Needs assistance Sitting-balance support: Bilateral upper extremity supported;Feet supported Sitting balance-Leahy Scale: Good     Standing balance support: Bilateral upper extremity supported (on RW) Standing balance-Leahy Scale: Good Standing balance comment: static standing                             Pertinent Vitals/Pain Pain Assessment: No/denies pain  Vitals stable and WFL throughout treatment session.    Home Living Family/patient expects to be discharged to:: Private residence Living Arrangements: Non-relatives/Friends (Pt's friend) Available Help at Discharge: Friend(s) (Friend works but can assist some)   Home Access: Stairs to enter Entrance Stairs-Rails: Engineer, manufacturing of Steps: 4 Home Layout: One level Home Equipment: Cane - single point      Prior Function Level of Independence: Independent with assistive device(s);Needs assistance   Gait / Transfers Assistance Needed: Pt using SPC occasionally (pt's friend reports he is supposed to use it more but doesn't)  ADL's / Homemaking Assistance Needed: Pt's friend assists with cooking and cleaning        Hand Dominance  Extremity/Trunk Assessment   Upper Extremity Assessment: Generalized weakness           Lower Extremity Assessment: Generalized weakness         Communication   Communication: HOH  Cognition Arousal/Alertness: Awake/alert Behavior During Therapy: WFL for tasks assessed/performed Overall Cognitive Status:  (Oriented to  person, place, time, and situation but appearing confused in general (ex: pt reported living alone with no steps to enter home))                      General Comments   Nursing cleared pt for participation in physical therapy.  Pt agreeable to PT session.    Exercises        Assessment/Plan    PT Assessment Patient needs continued PT services  PT Diagnosis Difficulty walking;Generalized weakness   PT Problem List Decreased balance;Decreased mobility;Decreased knowledge of use of DME;Decreased cognition  PT Treatment Interventions DME instruction;Gait training;Stair training;Functional mobility training;Therapeutic activities;Therapeutic exercise;Balance training;Patient/family education   PT Goals (Current goals can be found in the Care Plan section) Acute Rehab PT Goals Patient Stated Goal: to not be dizzy PT Goal Formulation: With patient Time For Goal Achievement: 04/11/15 Potential to Achieve Goals: Fair    Frequency Min 2X/week   Barriers to discharge Decreased caregiver support      Co-evaluation               End of Session Equipment Utilized During Treatment: Gait belt Activity Tolerance: Patient tolerated treatment well Patient left: in chair;with call bell/phone within reach;with chair alarm set;with family/visitor present Nurse Communication: Mobility status    Functional Assessment Tool Used: AM-PAC without stairs Functional Limitation: Mobility: Walking and moving around Mobility: Walking and Moving Around Current Status [phone](G8978): At least 40 percent but less than 60 percent impaired, limited or restricted Mobility: Walking and Moving Around Goal Status [phone](G8979): At least 1 percent but less than 20 percent impaired, limited or restricted    Time: [id number] PT Time Calculation (min) (ACUTE ONLY): 30 min   Charges:   PT Evaluation $Initial PT Evaluation Tier I: 1 Procedure PT Treatments $Therapeutic Exercise: 8-22 mins   PT G Codes:   PT  G-Codes **NOT FOR INPATIENT CLASS** Functional Assessment Tool Used: AM-PAC without stairs Functional Limitation: Mobility: Walking and moving around Mobility: Walking and Moving Around Current Status ([phone]): At least 40 percent but less than 60 percent impaired, limited or restricted Mobility: Walking and Moving Around Goal Status [phone](G8979): At least 1 percent but less than 20 percent impaired, limited or restricted    Hendricks Limesmily Liana Camerer 03/28/2015, 5:24 PM Hendricks LimesEmily Karilyn Wind, PT [phone]

## 2015-03-28 NOTE — H&P (Addendum)
Christian [name] is an 81 y.o. male.   Chief Complaint: Dizziness HPI: The patient presents emergency department complaining of dizziness and extreme weakness. He reports that he fell backward onto his tailbone a few days ago which has jarred his back and hurt his head. Notably he did not strike his head nor have loss of consciousness at that time. Since then he has been experiencing the sensation of the room spinning and describes how his feet somehow move backward when he intends for them to move forward. Tonight these symptoms culminated in the inability to reach down toward his toes to pull up his bedcovers. This distressed him so much that he felt as if "they might have to take him to the [place]" if EMS did not come to his rescue. In the emergency department the patient states that for the first 30 minutes of his stay he felt very dizzy and frightened. He now says the symptoms have resolved but that he still feels as if he may die. He denies chest pain, shortness of breath, nausea or vomiting. Given the gravity of his complaints emergency department staff called for admission.  Past Medical History  Diagnosis Date  . COPD (chronic obstructive pulmonary disease) ([place])   . Hyperlipidemia   . Hypertension   . Carotid stenosis   . GI bleed   . TIA (transient ischemic attack) 2014  . Hearing loss in left ear   . MI (myocardial infarction) [facility])     Past Surgical History  Procedure Laterality Date  . Appendectomy    . Prostate surgery      nodules benign  . Kidney lithotripsy    . Thumb surgery    . Cholecystectomy    . Coronary artery bypass graft    . Esophagogastroduodenoscopy  03/10/2011    Procedure: ESOPHAGOGASTRODUODENOSCOPY (EGD);  Surgeon: Missy Sabins, MD;  Location: [facility] ENDOSCOPY;  Service: Endoscopy;  Laterality: N/A;    Family History  Problem Relation Age of Onset  . Heart disease Mother     CAD  . Heart disease Father   . Emphysema Father    Social History:  reports  that he quit smoking about 13 years ago. His smoking use included Cigarettes. He has never used smokeless tobacco. He reports that he does not drink alcohol or use illicit drugs.  Allergies: No Known Allergies  Prior to Admission medications   Medication Sig Start Date End Date Taking? Authorizing Provider  aspirin 325 MG tablet Take 325 mg by mouth daily.   Yes Historical Provider, MD  atorvastatin (LIPITOR) 80 MG tablet Take 1 tablet (80 mg total) by mouth daily. 02/02/15  Yes Amy E Bedsole, MD  carvedilol (COREG) 6.25 MG tablet TAKE 1 TABLET (6.25 MG TOTAL) BY MOUTH 2 (TWO) TIMES DAILY. 02/02/15  Yes Amy E Bedsole, MD  lisinopril (PRINIVIL,ZESTRIL) 40 MG tablet TAKE 1 TABLET (40 MG TOTAL) BY MOUTH DAILY. 01/23/15  Yes Minna Merritts, MD  erythromycin ophthalmic ointment Place 1 application into the left eye at bedtime. Patient not taking: Reported on 03/28/2015 02/02/15   Jinny Sanders, MD  ketoconazole (NIZORAL) 2 % cream Apply 1 application topically 2 (two) times daily. Patient not taking: Reported on 03/28/2015 02/02/15   Jinny Sanders, MD    Results for orders placed or performed during the hospital encounter of 03/27/15 (from the past 48 hour(s))  CBC with Differential     Status: Abnormal   Collection Time: 03/28/15 12:08 AM  Result Value  Ref Range   WBC 7.9 3.8 - 10.6 K/uL   RBC 3.98 (L) 4.40 - 5.90 MIL/uL   Hemoglobin 13.0 13.0 - 18.0 g/dL   HCT 38.5 (L) 40.0 - 52.0 %   MCV 96.7 80.0 - 100.0 fL   MCH 32.8 26.0 - 34.0 pg   MCHC 33.9 32.0 - 36.0 g/dL   RDW 12.4 11.5 - 14.5 %   Platelets 186 150 - 440 K/uL   Neutrophils Relative % 52 %   Neutro Abs 4.1 1.4 - 6.5 K/uL   Lymphocytes Relative 29 %   Lymphs Abs 2.3 1.0 - 3.6 K/uL   Monocytes Relative 13 %   Monocytes Absolute 1.0 0.2 - 1.0 K/uL   Eosinophils Relative 5 %   Eosinophils Absolute 0.4 0 - 0.7 K/uL   Basophils Relative 1 %   Basophils Absolute 0.1 0 - 0.1 K/uL  Comprehensive metabolic panel     Status:  Abnormal   Collection Time: 03/28/15 12:08 AM  Result Value Ref Range   Sodium 137 135 - 145 mmol/L   Potassium 4.2 3.5 - 5.1 mmol/L   Chloride 107 101 - 111 mmol/L   CO2 23 22 - 32 mmol/L   Glucose, Bld 106 (H) 65 - 99 mg/dL   BUN 21 (H) 6 - 20 mg/dL   Creatinine, Ser 1.13 0.61 - 1.24 mg/dL   Calcium 8.8 (L) 8.9 - 10.3 mg/dL   Total Protein 6.5 6.5 - 8.1 g/dL   Albumin 3.4 (L) 3.5 - 5.0 g/dL   AST 21 15 - 41 U/L   ALT 17 17 - 63 U/L   Alkaline Phosphatase 86 38 - 126 U/L   Total Bilirubin 0.5 0.3 - 1.2 mg/dL   GFR calc non Af Amer 59 (L) >60 mL/min   GFR calc Af Amer >60 >60 mL/min    Comment: (NOTE) The eGFR has been calculated using the CKD EPI equation. This calculation has not been validated in all clinical situations. eGFR's persistently <60 mL/min signify possible Chronic Kidney Disease.    Anion gap 7 5 - 15  Troponin I     Status: None   Collection Time: 03/28/15 12:08 AM  Result Value Ref Range   Troponin I <0.03 <0.031 ng/mL    Comment:        NO INDICATION OF MYOCARDIAL INJURY.   Lactic acid, plasma     Status: None   Collection Time: 03/28/15 12:08 AM  Result Value Ref Range   Lactic Acid, Venous 1.8 0.5 - 2.0 mmol/L   Ct Head Wo Contrast  03/28/2015  CLINICAL DATA:  Dizziness and headache for several months. EXAM: CT HEAD WITHOUT CONTRAST TECHNIQUE: Contiguous axial images were obtained from the base of the skull through the vertex without intravenous contrast. COMPARISON:  12/24/2014. FINDINGS: Stable age related cerebral atrophy, ventriculomegaly and periventricular white matter disease. No extra-axial fluid collections are identified. No CT findings for acute hemispheric infarction or intracranial hemorrhage. No mass lesions. The brainstem and cerebellum are normal. No significant bony findings. The paranasal sinuses and mastoid air cells are clear. Stable vascular calcifications. The globes are intact. IMPRESSION: Stable age related cerebral atrophy,  ventriculomegaly and periventricular white matter disease. No new/ acute intracranial findings or mass lesions. Electronically Signed   By: Marijo Sanes M.D.   On: 03/28/2015 00:00   Dg Chest Port 1 View  03/28/2015  CLINICAL DATA:  Intermittent dizziness for 6 months. History of vertigo, out of medication. EXAM: PORTABLE CHEST 1  VIEW COMPARISON:  Chest radiograph February 10, 2015 FINDINGS: Cardiomediastinal silhouette are unchanged, status post median sternotomy for CABG. Mildly calcified aortic knob. Stable LEFT lung base linear densities. No pleural effusion or focal consolidation. No pneumothorax. Severe degenerative change of the included cervical spine. Soft tissue planes are normal. IMPRESSION: No acute cardiopulmonary process. Stable LEFT lung base atelectasis/ scarring. Electronically Signed   By: Elon Alas M.D.   On: 03/28/2015 00:07    Review of Systems  Constitutional: Negative for fever and chills.  HENT: Negative for sore throat and tinnitus.   Eyes: Negative for blurred vision and redness.  Respiratory: Negative for cough and shortness of breath.   Cardiovascular: Negative for chest pain, palpitations, orthopnea and PND.  Gastrointestinal: Negative for nausea, vomiting, abdominal pain and diarrhea.  Genitourinary: Negative for dysuria, urgency and frequency.  Musculoskeletal: Negative for myalgias and joint pain.  Skin: Positive for rash.       No lesions  Neurological: Positive for dizziness. Negative for speech change, focal weakness and weakness.  Endo/Heme/Allergies: Does not bruise/bleed easily.       No temperature intolerance  Psychiatric/Behavioral: Negative for depression and suicidal ideas. The patient is nervous/anxious.     Blood pressure 178/67, pulse 61, resp. rate 16, height _0  (1.6 m), weight 60.782 kg (134 lb), SpO2 97 %. Physical Exam  Nursing note and vitals reviewed. Constitutional: He is oriented to person, place, and time. He appears  well-developed and well-nourished. No distress.  HENT:  Head: Normocephalic and atraumatic.  Mouth/Throat: Oropharynx is clear and moist.  Eyes: EOM are normal. Pupils are equal, round, and reactive to light. Right conjunctiva is injected. No scleral icterus.  Ectropion of left eyelid   Neck: Normal range of motion. Neck supple. No JVD present. No tracheal deviation present. No thyromegaly present.  Cardiovascular: Normal rate, regular rhythm and normal heart sounds.  Exam reveals no gallop and no friction rub.   No murmur heard. Respiratory: Effort normal and breath sounds normal. No respiratory distress.  GI: Soft. Bowel sounds are normal. He exhibits no distension. There is no tenderness.  Genitourinary:  Deferred  Musculoskeletal: Normal range of motion. He exhibits no edema.  Lymphadenopathy:    He has no cervical adenopathy.  Neurological: He is alert and oriented to person, place, and time.  Skin: Skin is warm and dry. No rash noted. No erythema.  Psychiatric: He has a normal mood and affect. His behavior is normal. Judgment and thought content normal.     Assessment/Plan This is an 81 year old Caucasian male admitted for vertigo and severe anxiety. 1. Vertigo: Resolved. If symptoms return the patient has meclizine as needed for symptomatic relief. 2. Anxiety: Severe; the patient describes an impending sense of doom. This may be a panic attack. Psych consult at the discretion of the primary team although the patient seems to be reasonable and understands that his anxieties may worsen his organic vertigo and perceived joint pain. 3. Coronary artery disease: Stable; status post CABG. No anginal symptoms. Continue aspirin  4. Essential hypertension:Continue carvedilol and lisinopril. 5. Ectropion: This is a chronic problem which is stable. The patient currently does not take eyedrops for this problem but problems with his vision may contribute to his vertigo. Ophthalmology consult at  the discretion of the primary team. 6. DVT prophylaxis: Heparin 7. GI prophylaxis: None The patient is a full code. Time spent on admission orders and patient care approximately 45 minutes  Harrie Foreman 03/28/2015, 2:40 AM

## 2015-03-28 NOTE — Progress Notes (Signed)
[facility] [place] [facility]   PATIENT NAME: Simonne MaffucciRichard Banka    MR#:  [id number]  DATE OF BIRTH:  10/24/1931  SUBJECTIVE:  CHIEF COMPLAINT:   Chief Complaint  Patient presents with  . Dizziness   No dizziness but has weakness. REVIEW OF SYSTEMS:  CONSTITUTIONAL: No fever, has generalized weakness.  EYES: No blurred or double vision.  EARS, NOSE, AND THROAT: No tinnitus or ear pain.  RESPIRATORY: No cough, shortness of breath, wheezing or hemoptysis.  CARDIOVASCULAR: No chest pain, orthopnea, edema.  GASTROINTESTINAL: No nausea, vomiting, diarrhea or abdominal pain.  GENITOURINARY: No dysuria, hematuria.  ENDOCRINE: No polyuria, nocturia,  HEMATOLOGY: No anemia, easy bruising or bleeding SKIN: No rash or lesion. MUSCULOSKELETAL: No joint pain or arthritis.   NEUROLOGIC: No tingling, numbness, weakness.  PSYCHIATRY: No anxiety or depression.   DRUG ALLERGIES:  No Known Allergies  VITALS:  Blood pressure 114/39, pulse 56, temperature 98 F (36.7 C), temperature source Oral, resp. rate 18, height 5\' 3"  (1.6 m), weight 66.815 kg (147 lb 4.8 oz), SpO2 98 %.  PHYSICAL EXAMINATION:  GENERAL:  81 y.o.-year-old patient lying in the bed with no acute distress.  EYES: Pupils equal, round, reactive to light and accommodation. No scleral icterus. Extraocular muscles intact.  Right conjunctiva iis red. HEENT: Head atraumatic, normocephalic. Oropharynx and nasopharynx clear.  NECK:  Supple, no jugular venous distention. No thyroid enlargement, no tenderness.  LUNGS: Normal breath sounds bilaterally, no wheezing, rales,rhonchi or crepitation. No use of accessory muscles of respiration.  CARDIOVASCULAR: S1, S2 normal. No murmurs, rubs, or gallops.  ABDOMEN: Soft, nontender, nondistended. Bowel sounds present. No organomegaly or mass.  EXTREMITIES: No pedal edema, cyanosis, or clubbing.  NEUROLOGIC: Cranial nerves II through XII are intact. Muscle strength 5/5  in all extremities. Sensation intact. Gait not checked.  PSYCHIATRIC: The patient is alert and oriented x 3. Looks flat. SKIN: No obvious rash, lesion, or ulcer.    LABORATORY PANEL:   CBC  Recent Labs Lab 03/28/15 0008  WBC 7.9  HGB 13.0  HCT 38.5*  PLT 186   ------------------------------------------------------------------------------------------------------------------  Chemistries   Recent Labs Lab 03/28/15 0008  NA 137  K 4.2  CL 107  CO2 23  GLUCOSE 106*  BUN 21*  CREATININE 1.13  CALCIUM 8.8*  AST 21  ALT 17  ALKPHOS 86  BILITOT 0.5   ------------------------------------------------------------------------------------------------------------------  Cardiac Enzymes  Recent Labs Lab 03/28/15 0008  TROPONINI <0.03   ------------------------------------------------------------------------------------------------------------------  RADIOLOGY:  Ct Head Wo Contrast  03/28/2015  CLINICAL DATA:  Dizziness and headache for several months. EXAM: CT HEAD WITHOUT CONTRAST TECHNIQUE: Contiguous axial images were obtained from the base of the skull through the vertex without intravenous contrast. COMPARISON:  12/24/2014. FINDINGS: Stable age related cerebral atrophy, ventriculomegaly and periventricular white matter disease. No extra-axial fluid collections are identified. No CT findings for acute hemispheric infarction or intracranial hemorrhage. No mass lesions. The brainstem and cerebellum are normal. No significant bony findings. The paranasal sinuses and mastoid air cells are clear. Stable vascular calcifications. The globes are intact. IMPRESSION: Stable age related cerebral atrophy, ventriculomegaly and periventricular white matter disease. No new/ acute intracranial findings or mass lesions. Electronically Signed   By: Rudie MeyerP.  Gallerani M.D.   On: 03/28/2015 00:00   Dg Chest Port 1 View  03/28/2015  CLINICAL DATA:  Intermittent dizziness for 6 months. History of  vertigo, out of medication. EXAM: PORTABLE CHEST 1 VIEW COMPARISON:  Chest radiograph February 10, 2015 FINDINGS:  Cardiomediastinal silhouette are unchanged, status post median sternotomy for CABG. Mildly calcified aortic knob. Stable LEFT lung base linear densities. No pleural effusion or focal consolidation. No pneumothorax. Severe degenerative change of the included cervical spine. Soft tissue planes are normal. IMPRESSION: No acute cardiopulmonary process. Stable LEFT lung base atelectasis/ scarring. Electronically Signed   By: Awilda Metro M.D.   On: 03/28/2015 00:07    EKG:   Orders placed or performed during the hospital encounter of 03/27/15  . EKG 12-Lead  . EKG 12-Lead  . ED EKG  . ED EKG    ASSESSMENT AND PLAN:   This is an 81 year old Caucasian male admitted for vertigo and severe anxiety.  1. Vertigo: Resolved.  onmeclizine as needed for symptomatic relief. 2. Anxiety:  xanax when necessary. 3. Coronary artery disease: Stable; status post CABG. Continue aspirin  4. Essential hypertension:Continue carvedilol and lisinopril. 5. Ectropion: This is a chronic problem which is stable.  * UTI, start Rocephin and follow-up urine culture. * Mild dehydration, continue normal saline IV.  PT evaluation.  All the records are reviewed and case discussed with Care Management/Social Workerr. Management plans discussed with the patient, family and they are in agreement.  CODE STATUS: Full code.  TOTAL TIME TAKING CARE OF THIS PATIENT: 32 minutes.  Greater than 50% time was spent on coordination of care and face-to-face counseling.  POSSIBLE D/C IN 1 DAYS, DEPENDING ON CLINICAL CONDITION.   Shaune Pollack M.D on 03/28/2015 at 4:52 PM  Between 7am to 6pm - Pager - [phone]  After 6pm go to [email or web address] - password [email or web address]  [facility]  Office  [phone]  CC: Primary care physician; Kerby Nora, MD

## 2015-03-29 MED ORDER — DEXTROSE 5 % IV SOLN
1.0000 g | INTRAVENOUS | Status: DC
  Filled 2015-03-29: qty 10

## 2015-03-29 MED ORDER — MECLIZINE HCL 12.5 MG PO TABS: 12.5000 mg | ORAL_TABLET | Freq: Three times a day (TID) | ORAL | Status: AC | PRN

## 2015-03-29 MED ORDER — CEPHALEXIN 500 MG PO CAPS: 500.0000 mg | ORAL_CAPSULE | Freq: Four times a day (QID) | ORAL | Status: DC

## 2015-03-29 NOTE — Care Management Obs Status (Signed)
MEDICARE OBSERVATION STATUS NOTIFICATION   Patient Details  Name: Christian [name] MRN: [id number] Date of Birth: 03/10/1932   Medicare Observation Status Notification Given:  Yes    Collie Siadngela Lu Paradise, RN 03/29/2015, 8:00 AM

## 2015-03-29 NOTE — Progress Notes (Signed)
Concerns expressed to Dr. Imogene Burnhen about pt being discharged back home due to pt orientation status/ pt confused to time , place, and situation during my assessment/ per Dr. Imogene Burnhen pt A/O x4/ Dr. Imogene Burnhen states that the pt lives with girlfriend that helps take care of him and home health will follow pt/  communicated  plans with Angie, care manager.

## 2015-03-29 NOTE — Discharge Summary (Signed)
[facility] - [place] at [facility]   PATIENT NAME: Littleton Haub    MR#:  [id number]  DATE OF BIRTH:  04-25-1931  DATE OF ADMISSION:  03/27/2015 ADMITTING PHYSICIAN: Arnaldo Natal, MD  DATE OF DISCHARGE: 03/29/2015 PRIMARY CARE PHYSICIAN: Kerby Nora, MD    ADMISSION DIAGNOSIS:  Dizziness [R42] Chest pain, unspecified chest pain type [R07.9]   DISCHARGE DIAGNOSIS:   Vertigo UTI SECONDARY DIAGNOSIS:   Past Medical History  Diagnosis Date  . COPD (chronic obstructive pulmonary disease) (HCC)   . Hyperlipidemia   . Hypertension   . Carotid stenosis   . GI bleed   . TIA (transient ischemic attack) 2014  . Hearing loss in left ear   . MI (myocardial infarction) [facility])     HOSPITAL COURSE:  This is an 81 year old Caucasian male admitted for vertigo and severe anxiety.  1. Vertigo: Resolved. on meclizine as needed for symptomatic relief. 2. Anxiety: xanax when necessary. 3. Coronary artery disease: Stable; status post CABG. Continue aspirin  4. Essential hypertension:Continue carvedilol and lisinopril. 5. Ectropion: This is a chronic problem which is stable.  * UTI, treated with Rocephin and urine culture is nonsignificant. * Mild dehydration, treated wtih normal saline IV.  PT evaluation suggested home health and PT.  DISCHARGE CONDITIONS:   Stable, discharge to home with home health and PT today.  CONSULTS OBTAINED:     DRUG ALLERGIES:  No Known Allergies  DISCHARGE MEDICATIONS:   Current Discharge Medication List    START taking these medications   Details  cephALEXin (KEFLEX) 500 MG capsule Take 1 capsule (500 mg total) by mouth 4 (four) times daily. Qty: 20 capsule, Refills: 0    meclizine (ANTIVERT) 12.5 MG tablet Take 1 tablet (12.5 mg total) by mouth 3 (three) times daily as needed for dizziness. Qty: 30 tablet, Refills: 0      CONTINUE these medications which have NOT CHANGED   Details  aspirin 325 MG tablet  Take 325 mg by mouth daily.    atorvastatin (LIPITOR) 80 MG tablet Take 1 tablet (80 mg total) by mouth daily. Qty: 90 tablet, Refills: 3    carvedilol (COREG) 6.25 MG tablet TAKE 1 TABLET (6.25 MG TOTAL) BY MOUTH 2 (TWO) TIMES DAILY. Qty: 180 tablet, Refills: 3    lisinopril (PRINIVIL,ZESTRIL) 40 MG tablet TAKE 1 TABLET (40 MG TOTAL) BY MOUTH DAILY. Qty: 30 tablet, Refills: 1      STOP taking these medications     erythromycin ophthalmic ointment      ketoconazole (NIZORAL) 2 % cream          DISCHARGE INSTRUCTIONS:   If you experience worsening of your admission symptoms, develop shortness of breath, life threatening emergency, suicidal or homicidal thoughts you must seek medical attention immediately by calling 911 or calling your MD immediately  if symptoms less severe.  You Must read complete instructions/literature along with all the possible adverse reactions/side effects for all the Medicines you take and that have been prescribed to you. Take any new Medicines after you have completely understood and accept all the possible adverse reactions/side effects.   Please note  You were cared for by a hospitalist during your hospital stay. If you have any questions about your discharge medications or the care you received while you were in the hospital after you are discharged, you can call the unit and asked to speak with the hospitalist on call if the hospitalist that took care of you is not  available. Once you are discharged, your primary care physician will handle any further medical issues. Please note that NO REFILLS for any discharge medications will be authorized once you are discharged, as it is imperative that you return to your primary care physician (or establish a relationship with a primary care physician if you do not have one) for your aftercare needs so that they can reassess your need for medications and monitor your lab values.    Today   SUBJECTIVE   No  complaint.   VITAL SIGNS:  Blood pressure 158/56, pulse 55, temperature 98.5 F (36.9 C), temperature source Oral, resp. rate 16, height  (1.6 m), weight 66.815 kg (147 lb 4.8 oz), SpO2 99 %.  I/O:   Intake/Output Summary (Last 24 hours) at 03/29/15 1706 Last data filed at 03/29/15 1321  Gross per 24 hour  Intake 2354.58 ml  Output   1025 ml  Net 1329.58 ml    PHYSICAL EXAMINATION:  GENERAL:  81 y.o.-year-old patient lying in the bed with no acute distress.  EYES: Pupils equal, round, reactive to light and accommodation. No scleral icterus. Extraocular muscles intact.  HEENT: Head atraumatic, normocephalic. Oropharynx and nasopharynx clear.  NECK:  Supple, no jugular venous distention. No thyroid enlargement, no tenderness.  LUNGS: Normal breath sounds bilaterally, no wheezing, rales,rhonchi or crepitation. No use of accessory muscles of respiration.  CARDIOVASCULAR: S1, S2 normal. No murmurs, rubs, or gallops.  ABDOMEN: Soft, non-tender, non-distended. Bowel sounds present. No organomegaly or mass.  EXTREMITIES: No pedal edema, cyanosis, or clubbing.  NEUROLOGIC: Cranial nerves II through XII are intact. Muscle strength 5/5 in all extremities. Sensation intact. Gait not checked.  PSYCHIATRIC: The patient is alert and oriented x 3.  SKIN: No obvious rash, lesion, or ulcer.   DATA REVIEW:   CBC  Recent Labs Lab 03/28/15 0008  WBC 7.9  HGB 13.0  HCT 38.5*  PLT 186    Chemistries   Recent Labs Lab 03/28/15 0008  NA 137  K 4.2  CL 107  CO2 23  GLUCOSE 106*  BUN 21*  CREATININE 1.13  CALCIUM 8.8*  AST 21  ALT 17  ALKPHOS 86  BILITOT 0.5    Cardiac Enzymes  Recent Labs Lab 03/28/15 0008  TROPONINI <0.03    Microbiology Results  Results for orders placed or performed during the hospital encounter of 03/27/15  Urine culture     Status: None (Preliminary result)   Collection Time: 03/28/15  1:08 PM  Result Value Ref Range Status   Specimen  Description URINE, RANDOM  Final   Special Requests NONE  Final   Culture INSIGNIFICANT GROWTH  Final   Report Status PENDING  Incomplete    RADIOLOGY:  Ct Head Wo Contrast  03/28/2015  CLINICAL DATA:  Dizziness and headache for several months. EXAM: CT HEAD WITHOUT CONTRAST TECHNIQUE: Contiguous axial images were obtained from the base of the skull through the vertex without intravenous contrast. COMPARISON:  12/24/2014. FINDINGS: Stable age related cerebral atrophy, ventriculomegaly and periventricular white matter disease. No extra-axial fluid collections are identified. No CT findings for acute hemispheric infarction or intracranial hemorrhage. No mass lesions. The brainstem and cerebellum are normal. No significant bony findings. The paranasal sinuses and mastoid air cells are clear. Stable vascular calcifications. The globes are intact. IMPRESSION: Stable age related cerebral atrophy, ventriculomegaly and periventricular white matter disease. No new/ acute intracranial findings or mass lesions. Electronically Signed   By: Rudie Meyer M.D.   On: 03/28/2015  00:00   Dg Chest Port 1 View  03/28/2015  CLINICAL DATA:  Intermittent dizziness for 6 months. History of vertigo, out of medication. EXAM: PORTABLE CHEST 1 VIEW COMPARISON:  Chest radiograph February 10, 2015 FINDINGS: Cardiomediastinal silhouette are unchanged, status post median sternotomy for CABG. Mildly calcified aortic knob. Stable LEFT lung base linear densities. No pleural effusion or focal consolidation. No pneumothorax. Severe degenerative change of the included cervical spine. Soft tissue planes are normal. IMPRESSION: No acute cardiopulmonary process. Stable LEFT lung base atelectasis/ scarring. Electronically Signed   By: Awilda Metroourtnay  Bloomer M.D.   On: 03/28/2015 00:07        Management plans discussed with the patient, family and they are in agreement.  CODE STATUS:     Code Status Orders        Start     Ordered    03/28/15 0349  Full code   Continuous     03/28/15 0348      TOTAL TIME TAKING CARE OF THIS PATIENT: 37 minutes.    Shaune Pollackhen, Everett Ricciardelli M.D on 03/29/2015 at 5:06 PM  Between 7am to 6pm - Pager - [phone]  After 6pm go to [email or web address] - password [email or web address] [facility]  [facility]  Office  [phone]  CC: Primary care physician; Kerby NoraAmy Bedsole, MD

## 2015-03-29 NOTE — Care Management Note (Addendum)
Case Management Note  Patient Details  Name: EDRIS FRIEDT MRN: [id number] Date of Birth: 1931-07-16  Subjective/Objective:                   Met with patient to discuss discharge planning. He originally refused home health but now agrees and would like to use [facility]. He states he has a walker and cane in the home. He states his PCP is Eliezer Lofts of [place] [phone]. Patient states that he lives with a lady friend named Manuela Schwartz.  Action/Plan: List of home health agencies left with patient. Referral made to [facility] with [facility]. Message left for patient's son Sherren Mocha [phone] for transportation arrangements as Susan's contact number is not included in patient' profile.   Expected Discharge Date:  03/30/15               Expected Discharge Plan:     In-House Referral:     Discharge planning Services     Post Acute Care Choice:  Home Health Choice offered to:  Patient  DME Arranged:    DME Agency:     HH Arranged:  PT HH Agency:  [place]  Status of Service:  Completed, signed off  Medicare Important Message Given:    Date Medicare IM Given:    Medicare IM give by:    Date Additional Medicare IM Given:    Additional Medicare Important Message give by:     If discussed at [place] of Stay Meetings, dates discussed:    Additional Comments: [facility] has accepted patient for home health.  Marshell Garfinkel, RN 03/29/2015, 11:02 AM

## 2015-03-30 LAB — URINE CULTURE

## 2015-04-03 ENCOUNTER — Telehealth: Payer: Self-pay | Admitting: *Deleted

## 2015-04-03 NOTE — Telephone Encounter (Signed)
Transition Care Management Follow-up Telephone Call   Date discharged? 03/29/15   How have you been since you were released from the hospital? Still c/o episodes of dizziness and confusion    Do you understand why you were in the hospital? no   Do you understand the discharge instructions? no   Where were you discharged to? Home   Items Reviewed:  Medications reviewed: yes  Allergies reviewed: yes  Dietary changes reviewed: no  Referrals reviewed: yes, home health RN   Functional Questionnaire:   Activities of Daily Living (ADLs):   He states they are independent in the following: ambulation, bathing and hygiene, feeding, continence, grooming, toileting and dressing States they require assistance with the following: none   Any transportation issues/concerns?: yes, patient unsure of transportation at this time (seems confused)   Any patient concerns? yes, care received while inpatient/fear of falling - using walker or cane for ambulation   Confirmed importance and date/time of follow-up visits scheduled yes, 04/05/15 @ 1400  Provider Appointment booked with Hannah BeatSpencer Copland, MD  Confirmed with patient if condition begins to worsen call PCP or go to the ER.  Patient was given the office number and encouraged to call back with question or concerns.  : yes

## 2015-04-05 ENCOUNTER — Ambulatory Visit: Payer: Commercial Managed Care - HMO | Admitting: Family Medicine

## 2015-04-07 ENCOUNTER — Other Ambulatory Visit: Payer: Self-pay | Admitting: Cardiovascular Disease

## 2015-04-10 ENCOUNTER — Encounter: Payer: Self-pay | Admitting: Family Medicine

## 2015-04-10 ENCOUNTER — Ambulatory Visit (INDEPENDENT_AMBULATORY_CARE_PROVIDER_SITE_OTHER): Payer: Commercial Managed Care - HMO | Admitting: Family Medicine

## 2015-04-10 ENCOUNTER — Telehealth: Payer: Self-pay | Admitting: *Deleted

## 2015-04-10 VITALS — BP 124/64 | HR 81 | Temp 97.6°F | Wt 152.5 lb

## 2015-04-10 DIAGNOSIS — R42 Dizziness and giddiness: Secondary | ICD-10-CM

## 2015-04-10 DIAGNOSIS — S161XXA Strain of muscle, fascia and tendon at neck level, initial encounter: Secondary | ICD-10-CM | POA: Diagnosis not present

## 2015-04-10 NOTE — Progress Notes (Signed)
   Subjective:    Patient ID: Christian [name], male    DOB: 01/08/1932, 82 y.o.   MRN: [id number]  HPI  82 year old male pt presents for hospital follow up.  He was seen on 12/20 for atypical chest pain and dizziness. Had fall prior, landed on buttocks. Date of discharge 12/22.  Discharge Summary: 1. Vertigo: Resolved. on meclizine as needed for symptomatic relief. Cbc nml, cmet, TSH, A1c 2. Anxiety: This may have been the predominant diagnosis. Given  Xanax prn in hospital.  3.  Atypical chest [pain: neg troponin and lactic acidCoronary artery disease: Stable; status post CABG. Continue aspirin  4. Essential hypertension:Continue carvedilol and lisinopril. 5. Ectropion: This is a chronic problem which is stable. 6. UTI, treated with Rocephin and urine culture is nonsignificant. No further antibitoics were indicated. 7. Mild dehydration, treated wtih normal saline IV. nml creatinine on 12/21  PT evaluation suggested home health and PT.  Today pt reports:  No further chest pain. Still intermittent vertigo.  Using meclizine as needed. He has had 4 PT sessions.  No current low back pain, only pain in right neck.   Review of Systems  Constitutional: Negative for fever and fatigue.  HENT: Negative for ear pain.   Eyes: Negative for pain.  Respiratory: Negative for cough and shortness of breath.   Cardiovascular: Negative for chest pain, palpitations and leg swelling.  Gastrointestinal: Negative for abdominal pain.  Genitourinary: Negative for dysuria.  Musculoskeletal: Positive for back pain and arthralgias.  Neurological: Negative for syncope and headaches.  Psychiatric/Behavioral: Negative for dysphoric mood.       Objective:   Physical Exam  Constitutional: Vital signs are normal. He appears well-developed and well-nourished.  HENT:  Head: Normocephalic.  Right Ear: Hearing normal.  Left Ear: Hearing normal.  Nose: Nose normal.  Mouth/Throat: Oropharynx is clear and  moist and mucous membranes are normal.  Neck: Trachea normal. Muscular tenderness present. No spinous process tenderness present. Carotid bruit is not present. Decreased range of motion present. No edema and no erythema present. No thyroid mass and no thyromegaly present.    Cardiovascular: Normal rate, regular rhythm and normal pulses.  Exam reveals no gallop, no distant heart sounds and no friction rub.   No murmur heard. No peripheral edema  Pulmonary/Chest: Effort normal and breath sounds normal. No respiratory distress.  Skin: Skin is warm, dry and intact. No rash noted.  Psychiatric: He has a normal mood and affect. His speech is normal and behavior is normal. Thought content normal.          Assessment & Plan:

## 2015-04-10 NOTE — Assessment & Plan Note (Signed)
Heat, massage, gentle stretching, info given. Tylenol for pain.

## 2015-04-10 NOTE — Assessment & Plan Note (Signed)
Chronic. Start home balance PT.  Can consider MRI brain given memory issues and vertigo. If not improving consider for further eval./ referral to ENT. Pt very hesitant about further eval given  Poor experience in hospital recently.

## 2015-04-10 NOTE — Telephone Encounter (Signed)
Don (OT) called requesting a verbal order to see patient and work with him on ADL transfers, exercise, pain management, ulcer prevention.  Roe CoombsDon wants to make sure that it is okay to check patient's pulse ox. Don plans on seeing the patient once a week for 3 weeks. Don request a verbal order.

## 2015-04-10 NOTE — Telephone Encounter (Signed)
Okay to give verbal order.. But after pt seen today.. I would like to mainly request PT for right neck SCM strain and balance retraining/PT given recent vertigo.

## 2015-04-10 NOTE — Patient Instructions (Addendum)
Continue PT at home. Ask them to work with you on balance and neck pain.  Start gentle range motion exercise of neck, heat, tylenol for pain.  Use cane when out.  You can use meclizine OTC for vertigo as needed. It can make you sleepy, so limit the use.  Follow up in next 3-4 weeks 30 min OV.

## 2015-04-10 NOTE — Progress Notes (Signed)
Pre visit review using our clinic review tool, if applicable. No additional management support is needed unless otherwise documented below in the visit note. 

## 2015-04-11 NOTE — Telephone Encounter (Signed)
Verbal orders given to [facility]as instructed by Dr. Ermalene SearingBedsole.  He request we fax a Rx with PT orders requested to [phone].

## 2015-04-12 ENCOUNTER — Telehealth: Payer: Self-pay

## 2015-04-12 NOTE — Telephone Encounter (Signed)
Christian [name] OT [facility] left v/m wanting to verify pt is supposed to be continuing tamsulosin 0.4 mg one cap po daily. Pt has med and is taking med but is not on current med list since hospital discharge; reviewed 04/10/15 office note and did not see med noted in visit. Please advise. Don request cb.

## 2015-04-12 NOTE — Telephone Encounter (Signed)
Yes he needs to continue this med.

## 2015-04-12 NOTE — Telephone Encounter (Signed)
Left message for Christian [name] that [name] does need to continue the tamsulosin 0.4 mg cap.  Medication added back to medicine list.

## 2015-04-13 NOTE — Telephone Encounter (Signed)
In donna's outbox. 

## 2015-04-13 NOTE — Telephone Encounter (Signed)
Faxed to [phone] and scanned

## 2015-04-20 ENCOUNTER — Other Ambulatory Visit: Payer: Self-pay | Admitting: *Deleted

## 2015-04-20 DIAGNOSIS — I10 Essential (primary) hypertension: Secondary | ICD-10-CM

## 2015-04-20 NOTE — Patient Outreach (Signed)
[facility]) Care Management  04/20/2015  Christian [name] 09/17/1931 [id number]  Subjective: Telephone call to patient's home number, spoke with patient, and HIPAA verified.  Patient gave verbal authorization for [facility] to speak with caregiver [name] and son Marquavis Hannen regarding his healthcare needs.    Spoke with [name] states she has been living with patient since March 2016.  Ms. Cathlean Cower states she takes care of patient's home management and assist with activities of daily living.  States patient needs 24 hour care and she had spoken with Dalbert Garnet at [facility] earlier this week.  States she gave Ms. Coley details regarding patient's needs.   RNCM advised Ms. Cathlean Cower, RNCM had received a referral from Ms. Coley regarding patient's needs.  Ms. Cathlean Cower states patient needs 24 hour care and patient is currently alone while she works at a part time job.   Ms. Cathlean Cower states patient does not get along with his son's Onalee Hua and [name].   Ms. Cathlean Cower states patient's sons do not speak to each other.   Ms. Cathlean Cower states she overhead a conversation between patient and son Vega Withrow regarding patient leaving with son Carlus Stay) to move to [place] on Saturday 04/28/15.   Ms. Cathlean Cower states she works a part time job and the hours vary.     Ms. Cathlean Cower states patient is alone while she is at work.  Ms. Cathlean Cower states she can be contacted via cell phone [phone]) while she is at work.  States patient's sons feel that she is taking advantage of their father and do not speak to her.  Ms. Cathlean Cower states patient is currently receiving home health services through [facility] [phone]).   States patient's nurse Rosalita Chessman) with [facility], can be reached at [phone].    Discussed [facility] Care Management services with Ms. Cathlean Cower and patient, both voice understanding and are in agreement for patient to receive services.   Objective: Per Epic case review:  Patient has a history of hypertension, COPD, myocardial infarction, hyperlipidemia, and carotid stenosis.  Patient is a former smoker and quit smoking 16 years ago.  Patient's last hospitalization was for chest pain, urinary tract infection, and dizziness on 03/27/15 - 12/22//16.  Physical therapy home health orders were sent to [facility] for right neck strain and balance training post hospital discharge.  Per [facility] of Care Recommendation report, patient received Flu vaccine on 12/29/14.    Assessment: Received [facility] referral from [facility] on 04/20/15.  Per referral primary contact for patient per patient's consent is [name].  Reason for referral: Patient has dementia and is unable to provide self care.  Patient has a live in caregiver, who states she is no longer able to provide care for patient.  Patient is receiving home health physical therapy.  Please assess home environment.  States member does not have a healthcare power of attorney.   States patient is estranged from family living in [place] (son - Reynolds Kittel) [phone]  and in [place] (son - Davide Risdon [phone]).    Patient with recent hospitalization and discharge on 03/29/15.  Services requested: [facility] SW.   Patient has already been referred to [facility].   Patient in need of [facility] Community RNCM for home safety evaluation and home health care coordination.   Patient does not have any [facility] Telephonic RNCM needs at this time.   Plan: RNCM will send [facility] SW an Epic in basket message regarding plan for community follow up.  RNCM will refer patient to [facility] RNCM for home safety evaluation and home health care coordination.   Reyden Smith H. Gardiner Barefootooper RN, BSN, CCM [facility] Care [facility] Telephonic CM Phone: [phone] Fax: [phone]

## 2015-04-23 ENCOUNTER — Other Ambulatory Visit: Payer: Self-pay | Admitting: *Deleted

## 2015-04-23 NOTE — Patient Outreach (Signed)
[facility]) Care Management  04/23/2015  Christian AhmadiRichard J [name] 05/22/1931 [id number]  Subjective: Telephone call to Christian [name]) gave verbal update on Telephonic RNCM's conversation with patient and caregiver on 04/20/15 and rationale for [facility].   Christian RingsJanci states she will follow up with patient and notify Telephonic RNCM of referral outcome. Objective: Per Epic case review: Patient has a history of hypertension, COPD, myocardial infarction, hyperlipidemia, and carotid stenosis. Patient is a former smoker and quit smoking 16 years ago. Patient's last hospitalization was for chest pain, urinary tract infection, and dizziness on 03/27/15 - 12/22//16. Physical [facility] right neck strain and balance training post hospital discharge. Per [facility] Point of Care Recommendation report, patient received Flu vaccine on 12/29/14.   Assessment: [facility]/15. Per referral primary contact for patient per patient's consent is Christian GrahamSusan [name]. Reason for referral: Patient has dementia and is unable to provide self care. Patient has a live in caregiver, who states she is no longer able to provide care for patient. Patient is receiving home health physical therapy. Please assess home environment. States member does not have a healthcare power of attorney. States patient is estranged from family living in [place] (son - Christian [name]) [phone] and in [place] (son - Brayton LaymanDavid [name] [phone]). Patient with recent hospitalization and discharge on 03/29/15. [facility]. [facility]. Patient in need of [facility] Community RNCM for home safety evaluation and home health care coordination. Patient does not have any [facility] Telephonic RNCM needs at this time.   Plan: Patient has been referred to [facility] RN[facility] for home safety  evaluation and home health care coordination.   Christian Scalia H. Gardiner Barefootooper RN, BSN, CCM [facility] Care Management [facility] Telephonic CM Phone: [phone] Fax: [phone]

## 2015-04-25 ENCOUNTER — Other Ambulatory Visit: Payer: Self-pay | Admitting: *Deleted

## 2015-04-25 NOTE — Patient Outreach (Signed)
RNCM attempted to contact pt. Called pt's numbers but unable to reach pt. RNCM contacted pt's care-giver [name] and Ms. Cathlean Cower informed RNCM that pt moved to [place] on Wednesday with his son. RNCM thanked care giver for the information.   Plan: RNCM will close pt's case.  RNCM will make [facility] care manager assistant know of case closure.    Costella Hatcher RN, BSN  [facility] Care Management [phone])

## 2016-01-17 ENCOUNTER — Telehealth: Payer: Self-pay | Admitting: Cardiovascular Disease

## 2016-01-17 NOTE — Telephone Encounter (Signed)
3 attempts to schedule fu from recall.  Deleting recall.  °

## 2016-01-28 IMAGING — CT CT HEAD W/O CM
2 series · 13 of 30 positions shown, 15 images · non-contrast
Comparison: 12/24/2014.

CLINICAL DATA: Dizziness and headache for several months.

EXAM:
CT HEAD WITHOUT CONTRAST
TECHNIQUE: Contiguous axial images were obtained from the base of the skull
through the vertex without intravenous contrast.

[Series 2: head wo · axial · 0.43mm/px · z∈[-133,-34]mm · 5 of 34 slices shown, 7 images]
[im 6/34  brain]
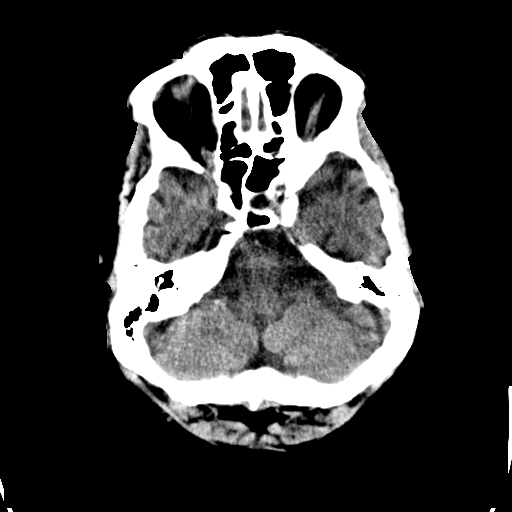
[im 6/34  bone]
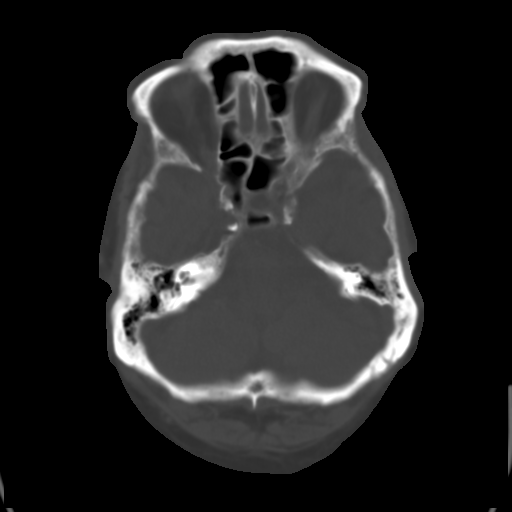
[im 12/34  brain]
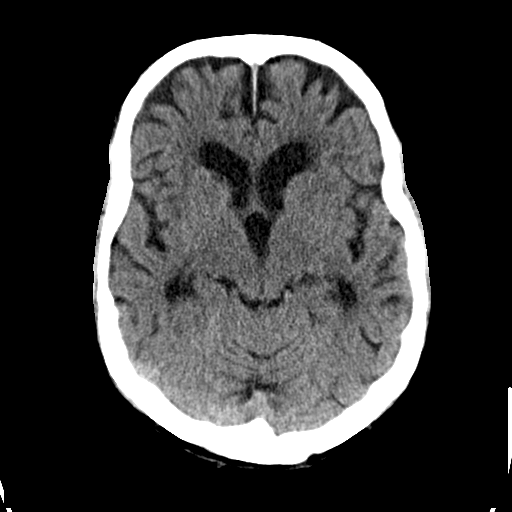
[im 17/34  brain]
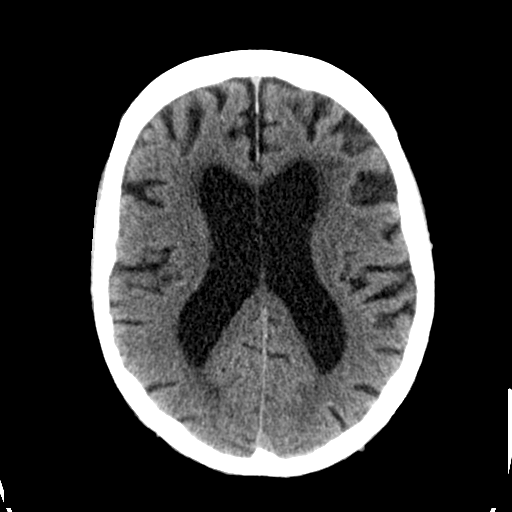
[im 23/34  brain]
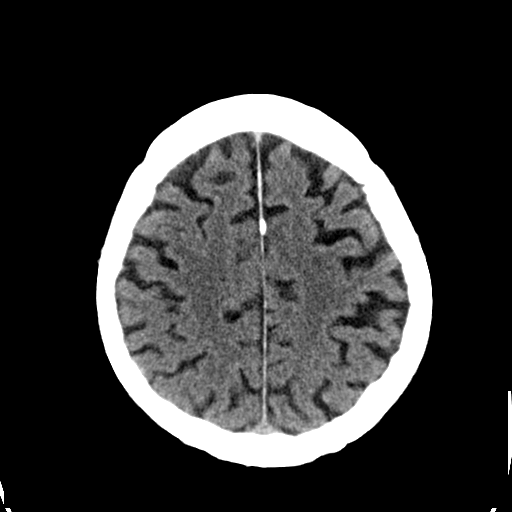
[im 28/34  brain]
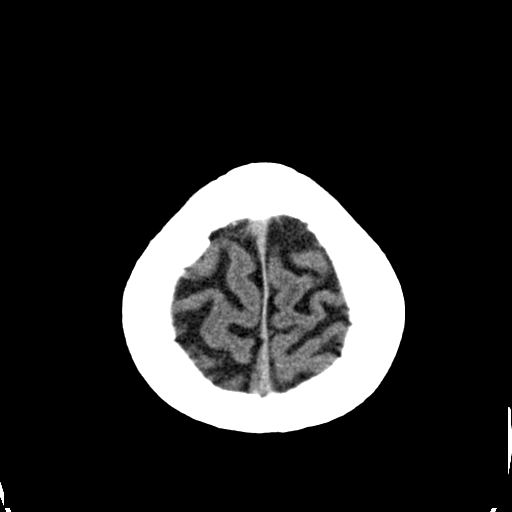
[im 28/34  bone]
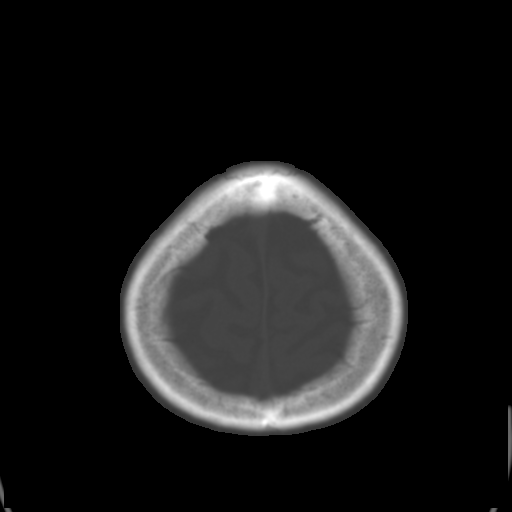

[Series 3: head bone · axial · 0.43mm/px · z∈[-143,-12]mm · 8 of 108 slices shown]
[im 10/108  bone]
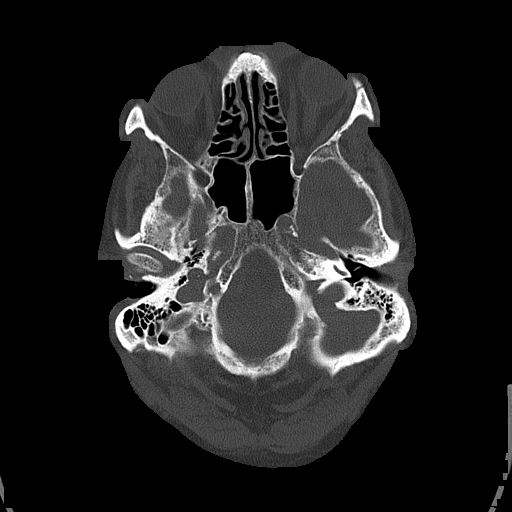
[im 20/108  bone]
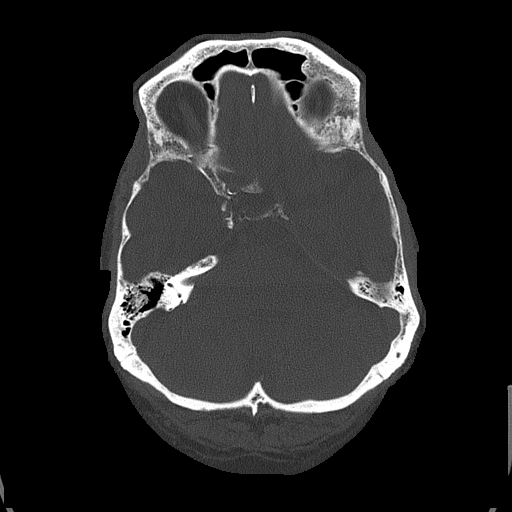
[im 35/108  bone]
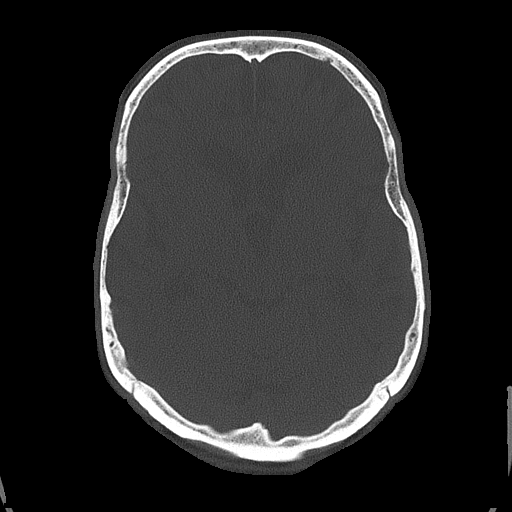
[im 49/108  bone]
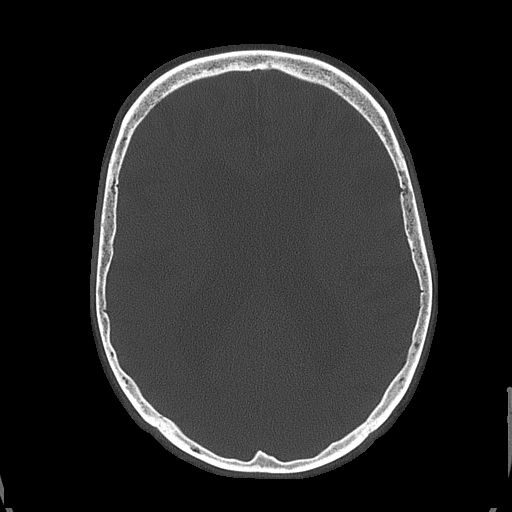
[im 59/108  bone]
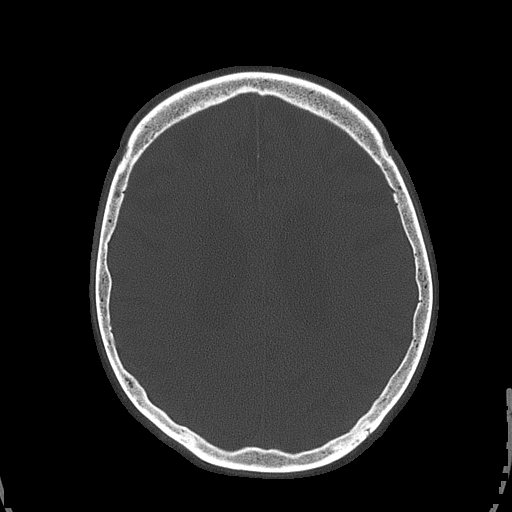
[im 73/108  bone]
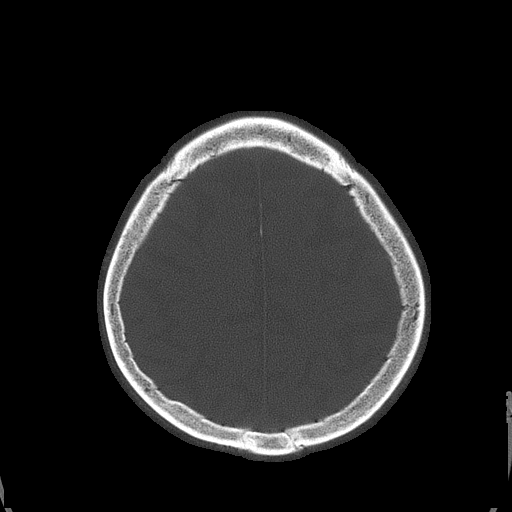
[im 88/108  bone]
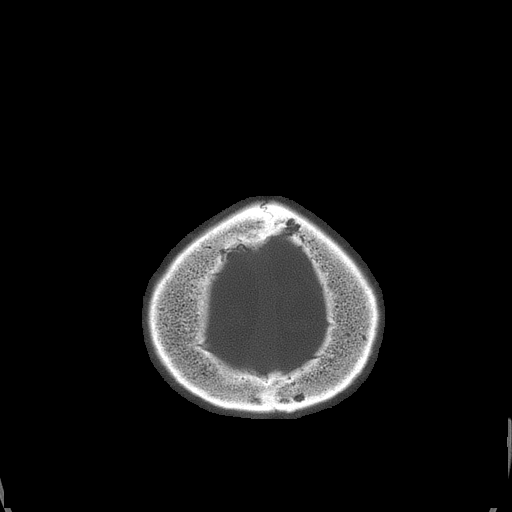
[im 98/108  bone]
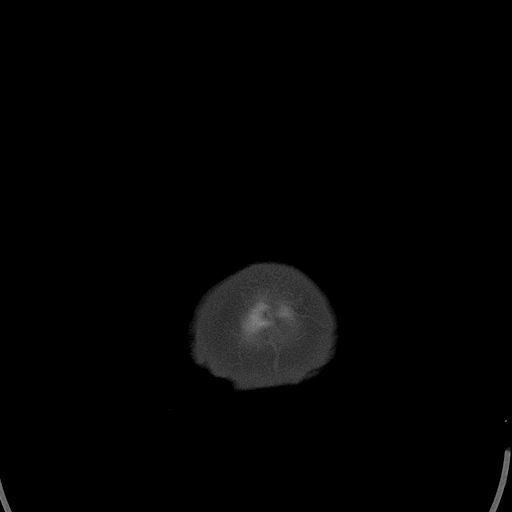

[13 of 30 positions shown; findings below may reference images not displayed]

FINDINGS: Stable age related cerebral atrophy, ventriculomegaly and
periventricular white matter disease. No extra-axial fluid
collections are identified. No CT findings for acute hemispheric
infarction or intracranial hemorrhage. No mass lesions. The
brainstem and cerebellum are normal.

No significant bony findings. The paranasal sinuses and mastoid air
cells are clear. Stable vascular calcifications. The globes are
intact.
IMPRESSION: Stable age related cerebral atrophy, ventriculomegaly and
periventricular white matter disease.

No new/ acute intracranial findings or mass lesions.

## 2016-05-26 ENCOUNTER — Telehealth: Payer: Self-pay | Admitting: Cardiovascular Disease

## 2016-05-26 NOTE — Telephone Encounter (Signed)
Patient has moved out of [place] into a nursing home. So, Tawanna Coolerodd his son, has requested that all his appointments be cancelled.

## 2017-03-07 DEATH — deceased

## 2017-06-05 DEATH — deceased
